# Patient Record
Sex: Female | Born: 1975 | Race: White | Hispanic: No | Marital: Married | Smoking: Never smoker
Health system: Southern US, Community
[De-identification: ages and names within clinical notes are randomized; demographics above are authoritative.]

## PROBLEM LIST (undated history)

## (undated) ENCOUNTER — Inpatient Hospital Stay (HOSPITAL_COMMUNITY): Payer: Self-pay

## (undated) DIAGNOSIS — K219 Gastro-esophageal reflux disease without esophagitis: Secondary | ICD-10-CM

## (undated) DIAGNOSIS — C801 Malignant (primary) neoplasm, unspecified: Secondary | ICD-10-CM

## (undated) DIAGNOSIS — R112 Nausea with vomiting, unspecified: Secondary | ICD-10-CM

## (undated) DIAGNOSIS — E0590 Thyrotoxicosis, unspecified without thyrotoxic crisis or storm: Secondary | ICD-10-CM

## (undated) DIAGNOSIS — O99345 Other mental disorders complicating the puerperium: Secondary | ICD-10-CM

## (undated) DIAGNOSIS — R51 Headache: Secondary | ICD-10-CM

## (undated) DIAGNOSIS — R519 Headache, unspecified: Secondary | ICD-10-CM

## (undated) DIAGNOSIS — F53 Postpartum depression: Secondary | ICD-10-CM

## (undated) DIAGNOSIS — F419 Anxiety disorder, unspecified: Secondary | ICD-10-CM

## (undated) DIAGNOSIS — Z9889 Other specified postprocedural states: Secondary | ICD-10-CM

## (undated) HISTORY — PX: WISDOM TOOTH EXTRACTION: SHX21

## (undated) HISTORY — PX: COLONOSCOPY: SHX174

---

## 2007-09-23 ENCOUNTER — Ambulatory Visit: Payer: Self-pay | Admitting: Family Medicine

## 2007-09-24 ENCOUNTER — Encounter: Payer: Self-pay | Admitting: Family Medicine

## 2008-03-25 ENCOUNTER — Ambulatory Visit: Payer: Self-pay | Admitting: Family Medicine

## 2008-03-27 ENCOUNTER — Ambulatory Visit: Payer: Self-pay | Admitting: Family Medicine

## 2008-06-22 ENCOUNTER — Ambulatory Visit: Payer: Self-pay | Admitting: Family Medicine

## 2008-06-22 DIAGNOSIS — K121 Other forms of stomatitis: Secondary | ICD-10-CM | POA: Insufficient documentation

## 2008-06-22 DIAGNOSIS — B009 Herpesviral infection, unspecified: Secondary | ICD-10-CM

## 2008-06-23 LAB — CONVERTED CEMR LAB
AST: 17 units/L (ref 0–37)
Albumin: 3.6 g/dL (ref 3.5–5.2)
BUN: 9 mg/dL (ref 6–23)
Calcium: 9 mg/dL (ref 8.4–10.5)
Chloride: 106 meq/L (ref 96–112)
Glucose, Bld: 94 mg/dL (ref 70–99)
Lymphs Abs: 2.5 10*3/uL (ref 0.7–4.0)
Monocytes Relative: 9 % (ref 3–12)
Neutro Abs: 3.6 10*3/uL (ref 1.7–7.7)
Neutrophils Relative %: 53 % (ref 43–77)
Potassium: 4.2 meq/L (ref 3.5–5.3)
RBC: 4.73 M/uL (ref 3.87–5.11)
Sodium: 141 meq/L (ref 135–145)
Total Protein: 6.9 g/dL (ref 6.0–8.3)
WBC: 6.8 10*3/uL (ref 4.0–10.5)

## 2008-06-25 ENCOUNTER — Telehealth: Payer: Self-pay | Admitting: Family Medicine

## 2008-07-02 ENCOUNTER — Ambulatory Visit: Payer: Self-pay | Admitting: Family Medicine

## 2008-07-02 LAB — CONVERTED CEMR LAB
Blood in Urine, dipstick: NEGATIVE
Glucose, Urine, Semiquant: NEGATIVE
Nitrite: NEGATIVE
Urobilinogen, UA: 0.2

## 2009-04-20 ENCOUNTER — Ambulatory Visit (HOSPITAL_COMMUNITY): Admission: RE | Admit: 2009-04-20 | Discharge: 2009-04-20 | Payer: Self-pay | Admitting: Obstetrics and Gynecology

## 2010-06-27 ENCOUNTER — Inpatient Hospital Stay (HOSPITAL_COMMUNITY): Admission: AD | Admit: 2010-06-27 | Discharge: 2010-07-01 | Payer: Self-pay | Admitting: Obstetrics and Gynecology

## 2010-09-29 ENCOUNTER — Telehealth: Payer: Self-pay | Admitting: Family Medicine

## 2010-09-29 ENCOUNTER — Ambulatory Visit: Payer: Self-pay | Admitting: Family Medicine

## 2010-09-29 DIAGNOSIS — G43909 Migraine, unspecified, not intractable, without status migrainosus: Secondary | ICD-10-CM

## 2010-10-12 ENCOUNTER — Telehealth: Payer: Self-pay | Admitting: Family Medicine

## 2010-12-13 NOTE — Assessment & Plan Note (Signed)
Summary: OM   Vital Signs:  Patient profile:   35 year old female Height:      62.3 inches Weight:      168 pounds Temp:     99.5 degrees F Pulse rate:   80 / minute BP sitting:   130 / 89  (right arm) Cuff size:   regular  Vitals Entered By: Avon Gully CMA, Duncan Dull) (September 29, 2010 1:55 PM) CC: sinus presssure x 1 week, wants refill for zomig   Primary Care Provider:  Linford Arnold, C  CC:  sinus presssure x 1 week and wants refill for zomig.  History of Present Illness: sinus presssure x 1 week, wants refill for zomig. Started with ear pressure and muffled sounds in her left ear. Started Allegra-D and would help some. Now pain and pressure over the left eye and maxillary sinus. Last night pain was severe.  Took Excedrin sinus last night and that helped some.  Nose has been runny.    Current Medications (verified): 1)  Yaz 3-0.02 Mg  Tabs (Drospirenone-Ethinyl Estradiol) .... Take One Tablet By Mouth Once A Day 2)  Zomig 5 Mg  Tabs (Zolmitriptan) .... Take One Tablet By Mouth Once A Day As Needed For H/a 3)  Acyclovir 800 Mg  Tabs (Acyclovir) .... Take 1 Tablet By Mouth Two  Times A Day For 3 Days 4)  Synthroid 50 Mcg Tabs (Levothyroxine Sodium) .... Take One Tablet By Mouth Once A Day  Allergies (verified): 1)  ! Erythromycin 2)  ! Sulfa  Comments:  Nurse/Medical Assistant: The patient's medications and allergies were reviewed with the patient and were updated in the Medication and Allergy Lists. 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  Never Smoked Alcohol use-yes Drug use-no Regular exercise-no  Physical Exam  General:  Well-developed,well-nourished,in no acute distress; alert,appropriate and cooperative throughout examination Head:  Normocephalic and atraumatic without obvious abnormalities. No apparent alopecia or balding. She is puffy under both  eyes.  Mild facial tenderness on the left under the eye.  Eyes:  No corneal or conjunctival inflammation noted. EOMI.  Ears:  External ear exam shows no significant lesions or deformities.  Right TM and canal were clear with good light reflex. Left TM with dull, erythematous with fluid.   Nose:  External nasal examination shows no deformity or inflammation.  Mouth:  Oral mucosa and oropharynx without lesions or exudates.  Teeth in good repair. Neck:  No deformities, masses, or tenderness noted. Lungs:  Normal respiratory effort, chest expands symmetrically. Lungs are clear to auscultation, no crackles or wheezes. Heart:  Normal rate and regular rhythm. S1 and S2 normal without gallop, murmur, click, rub or other extra sounds. Skin:  no rashes.  no rashes.   Cervical Nodes:  No lymphadenopathy noted Psych:  Cognition and judgment appear intact. Alert and cooperative with normal attention span and concentration. No apparent delusions, illusions, hallucinations   Impression & Recommendations:  Problem # 1:  OTITIS MEDIA, ACUTE (ICD-382.9)  The following medications were removed from the medication list:    Cipro 500 Mg Tabs (Ciprofloxacin hcl) .Marland Kitchen... Take 1 tablet by mouth two times a day Her updated medication list for this problem includes:    Augmentin 875-125 Mg Tabs (Amoxicillin-pot clavulanate) .Marland Kitchen... Take 1 tablet by mouth two times a day for 10 days  Instructed on prevention and treatment. Call if no improvement in 48-72  hours or sooner if worsening symptoms.   Problem # 2:  MIGRAINE HEADACHE (ICD-346.90) Refilled her zomig Her updated medication list for this problem includes:    Zomig 5 Mg Tabs (Zolmitriptan) .Marland Kitchen... Take one tablet by mouth once a day as needed for h/a  Complete Medication List: 1)  Yaz 3-0.02 Mg Tabs (Drospirenone-ethinyl estradiol) .... Take one tablet by mouth once a day 2)  Zomig 5 Mg Tabs (Zolmitriptan) .... Take one tablet by mouth once a day as needed for  h/a 3)  Acyclovir 800 Mg Tabs (Acyclovir) .... Take 1 tablet by mouth two  times a day for 3 days 4)  Synthroid 50 Mcg Tabs (Levothyroxine sodium) .... Take one tablet by mouth once a day 5)  Augmentin 875-125 Mg Tabs (Amoxicillin-pot clavulanate) .... Take 1 tablet by mouth two times a day for 10 days  Patient Instructions: 1)  Call if not better by Monday. Complete all the antibiotic.  Prescriptions: AUGMENTIN 875-125 MG TABS (AMOXICILLIN-POT CLAVULANATE) Take 1 tablet by mouth two times a day for 10 days  #20 x 0   Entered and Authorized by:   Nani Gasser MD   Signed by:   Nani Gasser MD on 09/29/2010   Method used:   Electronically to        [facility]* (retail)       [address] [place], [place]  [address]       Ph: [phone]       Fax: [phone]   RxID:   [id number] ZOMIG 5 MG  TABS (ZOLMITRIPTAN) Take one tablet by mouth once a day as needed for H/A  #9 tabs x 3   Entered and Authorized by:   Nani Gasser MD   Signed by:   Nani Gasser MD on 09/29/2010   Method used:   Electronically to        [facility]* (retail)       [address] [place], [place]  [address]       Ph: [phone]       Fax: [phone]   RxID:   [id number]    Orders Added: 1)  Est. Patient Level III [16010]    Laboratory Results

## 2010-12-13 NOTE — Progress Notes (Signed)
  Phone Note Call from Patient   Caller: Patient Summary of Call: called insu co fro PA on zomig and ins does not require PA for this med. notified pt and pharm Initial call taken by: Avon Gully CMA, Duncan Dull),  October 12, 2010 3:42 PM

## 2010-12-13 NOTE — Progress Notes (Signed)
Summary: Zomig not covered by ins. needs Imitrex sent first  Phone Note Call from Patient Call back at Home Phone [phone]   Caller: Patient Call For: Nani Gasser MD Summary of Call: Insurance won't cover the Zomig. Insurance wants Imitrex generic tried first. Please send this to her pharmacy Initial call taken by: Kathlene November LPN,  September 29, 2010 4:05 PM  Follow-up for Phone Call        Rx Called In Follow-up by: Nani Gasser MD,  September 29, 2010 4:20 PM    New/Updated Medications: SUMATRIPTAN SUCCINATE 100 MG TABS (SUMATRIPTAN SUCCINATE) Take 1 tablet by mouth once a day as needed for migraine. Can repeat dose in 2 hours Prescriptions: SUMATRIPTAN SUCCINATE 100 MG TABS (SUMATRIPTAN SUCCINATE) Take 1 tablet by mouth once a day as needed for migraine. Can repeat dose in 2 hours  #6 x 0   Entered and Authorized by:   Nani Gasser MD   Signed by:   Nani Gasser MD on 09/29/2010   Method used:   Electronically to        [facility]* (retail)       [address] [place], [place]  [address]       Ph: [phone]       Fax: [phone]   RxID:   [id number]

## 2010-12-27 ENCOUNTER — Encounter: Payer: Self-pay | Admitting: Family Medicine

## 2010-12-27 ENCOUNTER — Ambulatory Visit (INDEPENDENT_AMBULATORY_CARE_PROVIDER_SITE_OTHER): Payer: BC Managed Care – PPO | Admitting: Family Medicine

## 2010-12-27 DIAGNOSIS — J069 Acute upper respiratory infection, unspecified: Secondary | ICD-10-CM

## 2010-12-27 DIAGNOSIS — J029 Acute pharyngitis, unspecified: Secondary | ICD-10-CM | POA: Insufficient documentation

## 2011-01-04 NOTE — Assessment & Plan Note (Signed)
Summary: Sore THroat    Vital Signs:  Patient profile:   35 year old female Height:      62.3 inches Weight:      174 pounds O2 Sat:      98.2 % on Room air Temp:     98.2 degrees F oral Pulse rate:   74 / minute BP sitting:   124 / 78  (right arm) Cuff size:   regular  Vitals Entered By: Avon Gully CMA, Duncan Dull) (December 27, 2010 9:14 AM)  O2 Flow:  Room air CC: chest congestion, sore throat, some drainage   Primary Care Provider:  Linford Arnold, C  CC:  chest congestion, sore throat, and some drainage.  History of Present Illness: chest congestion, sore throat, some drainage.  Started with a ST about a week ago.  Then about 3 days ago started with a cough.  45 month old with an ear infection. No ear pain or head pressure.  Nasal drip.  Cough is productive.  No fever. Using chlorseptic lozenges.  Some diarrhea for 4-5 days.  No nausea. Throat is still really sore. Worse at night.   Current Medications (verified): 1)  Yaz 3-0.02 Mg  Tabs (Drospirenone-Ethinyl Estradiol) .... Take One Tablet By Mouth Once A Day 2)  Sumatriptan Succinate 100 Mg Tabs (Sumatriptan Succinate) .... Take 1 Tablet By Mouth Once A Day As Needed For Migraine. Can Repeat Dose in 2 Hours 3)  Synthroid 50 Mcg Tabs (Levothyroxine Sodium) .... Take One Tablet By Mouth Once A Day  Allergies (verified): 1)  ! Erythromycin 2)  ! Sulfa  Comments:  Nurse/Medical Assistant: The patient's medications and allergies were reviewed with the patient and were updated in the Medication and Allergy Lists. Avon Gully CMA, Duncan Dull) (December 27, 2010 9:15 AM)  Physical Exam  General:  Well-developed,well-nourished,in no acute distress; alert,appropriate and cooperative throughout examination Head:  Normocephalic and atraumatic without obvious abnormalities. No apparent alopecia or balding. Some fullnes under her eyes.  Eyes:  No corneal or conjunctival inflammation noted. EOMI. Perrla.  Ears:  External ear exam  shows no significant lesions or deformities.  Otoscopic examination reveals clear canals, tympanic membranes are intact bilaterally without bulging, retraction, inflammation or discharge. Hearing is grossly normal bilaterally. Nose:  External nasal examination shows no deformity or inflammation. Mouth:  Oral mucosa and oropharynx without lesions or exudates.  Teeth in good repair. Neck:  No deformities, masses, or tenderness noted. Lungs:  Normal respiratory effort, chest expands symmetrically. Lungs are clear to auscultation, no crackles or wheezes. Heart:  Normal rate and regular rhythm. S1 and S2 normal without gallop, murmur, click, rub or other extra sounds. Skin:  no rashes.   Cervical Nodes:  No lymphadenopathy noted Psych:  Cognition and judgment appear intact. Alert and cooperative with normal attention span and concentration. No apparent delusions, illusions, hallucinations   Impression & Recommendations:  Problem # 1:  URI (ICD-465.9)  Instructed on symptomatic treatment. Call if symptoms persist or worsen.  Call if not bette rin 4-5 days or suddenly get worse.   Problem # 2:  ACUTE PHARYNGITIS (ICD-462)  Rapid strep was negl. Likely vira.  The following medications were removed from the medication list:    Augmentin 875-125 Mg Tabs (Amoxicillin-pot clavulanate) .Marland Kitchen... Take 1 tablet by mouth two times a day for 10 days  Orders: Rapid Strep (40981)  Complete Medication List: 1)  Yaz 3-0.02 Mg Tabs (Drospirenone-ethinyl estradiol) .... Take one tablet by mouth once a day 2)  Sumatriptan  Succinate 100 Mg Tabs (Sumatriptan succinate) .... Take 1 tablet by mouth once a day as needed for migraine. can repeat dose in 2 hours 3)  Synthroid 50 Mcg Tabs (Levothyroxine sodium) .... Take one tablet by mouth once a day   Orders Added: 1)  Rapid Strep [16109] 2)  Est. Patient Level III [60454]    Laboratory Results  Date/Time Received: 12/27/10 Date/Time Reported:  12/27/10  Other Tests  Rapid Strep: negative

## 2011-01-27 LAB — CBC
HCT: 35.3 % — ABNORMAL LOW (ref 36.0–46.0)
MCH: 30.6 pg (ref 26.0–34.0)
MCHC: 33.5 g/dL (ref 30.0–36.0)
MCHC: 34.3 g/dL (ref 30.0–36.0)
MCV: 88.5 fL (ref 78.0–100.0)
MCV: 89.1 fL (ref 78.0–100.0)
Platelets: 191 10*3/uL (ref 150–400)
Platelets: 254 10*3/uL (ref 150–400)
RBC: 3.51 MIL/uL — ABNORMAL LOW (ref 3.87–5.11)
RDW: 14.1 % (ref 11.5–15.5)
RDW: 14.3 % (ref 11.5–15.5)
WBC: 12.5 10*3/uL — ABNORMAL HIGH (ref 4.0–10.5)

## 2011-09-04 ENCOUNTER — Inpatient Hospital Stay (INDEPENDENT_AMBULATORY_CARE_PROVIDER_SITE_OTHER)
Admission: RE | Admit: 2011-09-04 | Discharge: 2011-09-04 | Disposition: A | Discharge status: Home / Self Care | Payer: BC Managed Care – PPO | Source: Ambulatory Visit | Attending: Family Medicine | Admitting: Family Medicine

## 2011-09-04 ENCOUNTER — Encounter: Payer: Self-pay | Admitting: Family Medicine

## 2011-09-04 DIAGNOSIS — E039 Hypothyroidism, unspecified: Secondary | ICD-10-CM | POA: Insufficient documentation

## 2011-09-04 DIAGNOSIS — J069 Acute upper respiratory infection, unspecified: Secondary | ICD-10-CM

## 2011-09-14 ENCOUNTER — Ambulatory Visit (INDEPENDENT_AMBULATORY_CARE_PROVIDER_SITE_OTHER): Payer: BC Managed Care – PPO | Admitting: Family Medicine

## 2011-09-14 DIAGNOSIS — B001 Herpesviral vesicular dermatitis: Secondary | ICD-10-CM

## 2011-09-14 DIAGNOSIS — B009 Herpesviral infection, unspecified: Secondary | ICD-10-CM

## 2011-09-14 DIAGNOSIS — R220 Localized swelling, mass and lump, head: Secondary | ICD-10-CM

## 2011-09-14 MED ORDER — VALACYCLOVIR HCL 1 G PO TABS: 2000.0000 mg | ORAL_TABLET | Freq: Two times a day (BID) | ORAL | Status: AC

## 2011-09-14 MED ORDER — PREDNISONE 20 MG PO TABS: 40.0000 mg | ORAL_TABLET | Freq: Every day | ORAL | Status: AC

## 2011-09-14 NOTE — Patient Instructions (Signed)
Call if not getting better.  

## 2011-09-14 NOTE — Progress Notes (Signed)
  Subjective:    Patient ID: Tina Hart, female    DOB: 06-19-1976, 35 y.o.   MRN: [id number]  HPI Seen last week at [facility] for sinus sxs an d cough and felt better on zpack. Nasal Drainage started again yesterday AM, and wake up this am and says the left side of her face is swollen and "filled with fluid".  No sinus congestion.  Cough is resolved.  No fever. Also cold sore popped up yesteday on that left corner of the mouth. No GI symptoms. She had a similar episode a couple years ago when she got a cold sore and she had multiple lymph nodes in her neck and upper chest area that were swollen and sore. She has not been using any treatments or medications.  Review of Systems     Objective:   Physical Exam  Constitutional: She is oriented to person, place, and time. She appears well-developed and well-nourished.  HENT:  Head: Normocephalic and atraumatic.  Right Ear: External ear normal.  Left Ear: External ear normal.  Nose: Nose normal.  Mouth/Throat: Oropharynx is clear and moist.       TMs and canals are clear.   Eyes: Conjunctivae and EOM are normal. Pupils are equal, round, and reactive to light.  Neck: Neck supple. No thyromegaly present.  Cardiovascular: Normal rate, regular rhythm and normal heart sounds.   Pulmonary/Chest: Effort normal and breath sounds normal. She has no wheezes.  Lymphadenopathy:    She has no cervical adenopathy.  Neurological: She is alert and oriented to person, place, and time.  Skin: Skin is warm and dry.  Psychiatric: She has a normal mood and affect.      Left lower jaw line is swollen. No erythema or fluctuance. She is not tender along the gum line. She does have a small cold sore on the left corner crease of mouth. OP is normal.     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 I will go ahead and order back on Valtrex which she has responded to well in the past. If the swelling doesn't  significantly improve in the next 48 hours and she can go ahead and fill the prescription for prednisone since that will be over the weekend. I warned her that if she spikes a fever or starts to notice redness over the swollen area that she needs to seek medical care immediately.

## 2011-10-16 NOTE — Progress Notes (Signed)
Summary: running nose,coughing,chest congestion (rm 3)   Vital Signs:  Patient Profile:   35 Years Old Female CC:      cough, congestion, runny nose x 4 days Height:     62.3 inches Weight:      171 pounds O2 Sat:      99 % O2 treatment:    Room Air Temp:     98.6 degrees F oral Pulse rate:   62 / minute Resp:     16 per minute BP sitting:   118 / 80  (left arm) Cuff size:   regular  Vitals Entered By: Lajean Saver RN (September 04, 2011 11:49 AM)                  Updated Prior Medication List: YAZ 3-0.02 MG  TABS (DROSPIRENONE-ETHINYL ESTRADIOL) Take one tablet by mouth once a day SUMATRIPTAN SUCCINATE 100 MG TABS (SUMATRIPTAN SUCCINATE) Take 1 tablet by mouth once a day as needed for migraine. Can repeat dose in 2 hours SYNTHROID 50 MCG TABS (LEVOTHYROXINE SODIUM) take one tablet by mouth once a day  Current Allergies (reviewed today): ! ERYTHROMYCIN ! SULFAHistory of Present Illness Chief Complaint: cough, congestion, runny nose x 4 days History of Present Illness:  Subjective: Patient complains of URI symptoms for 4 days that started with sneezing + productive cough started yesterday No sore throat No pleuritic pain No wheezing + nasal congestion + post-nasal drainage + sinus pain/pressure No itchy/red eyes No earache No hemoptysis No SOB No fever/chills No nausea No vomiting No abdominal pain No diarrhea No skin rashes + fatigue No myalgias No headache Used OTC meds without relief (Daquil, Nyquil)  REVIEW OF SYSTEMS Constitutional Symptoms      Denies fever, chills, night sweats, weight loss, weight gain, and fatigue.  Eyes       Denies change in vision, eye pain, eye discharge, glasses, contact lenses, and eye surgery. Ear/Nose/Throat/Mouth       Complains of sore throat.      Denies hearing loss/aids, change in hearing, ear pain, ear discharge, dizziness, frequent runny nose, frequent nose bleeds, sinus problems, hoarseness, and tooth pain or  bleeding.      Comments: congestion Respiratory       Complains of productive cough.      Denies dry cough, wheezing, shortness of breath, asthma, bronchitis, and emphysema/COPD.  Cardiovascular       Denies murmurs, chest pain, and tires easily with exhertion.    Gastrointestinal       Denies stomach pain, nausea/vomiting, diarrhea, constipation, blood in bowel movements, and indigestion. Genitourniary       Denies painful urination, blood or discharge from vagina, kidney stones, and loss of urinary control. Neurological       Denies paralysis, seizures, and fainting/blackouts. Musculoskeletal       Denies muscle pain, joint pain, joint stiffness, decreased range of motion, redness, swelling, muscle weakness, and gout.  Skin       Denies bruising, unusual mles/lumps or sores, and hair/skin or nail changes.  Psych       Denies mood changes, temper/anger issues, anxiety/stress, speech problems, depression, and sleep problems. Other Comments: Patient c/o symptoms x 4 days. Taken day/nyquil otc   Past History:  Past Medical History: Hx menstrual Migraines Hypothyroidism  Past Surgical History:  Caesarean section  Family History: Reviewed history from 09/23/2007 and no changes required. Mother had melanoma removed 30 years ago. Father with MI, CABG, hi cholesterol, HTN  Social History:  Reviewed history from 09/29/2010 and no changes required. 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  Never Smoked Alcohol use-yes Drug use-no Regular exercise-no   Objective:  Appearance:  Patient appears healthy, stated age, and in no acute distress  Eyes:  Pupils are equal, round, and reactive to light and accomodation.  Extraocular movement is intact.  Conjunctivae are not inflamed.  Ears:  Canals normal.  Tympanic membranes normal.   Nose:  Mildly congested turbinates.  No sinus tenderness  Pharynx:  Normal  Neck:  Supple.  Tender shotty posterior  nodes are palpated bilaterally.  Lungs:  Clear to auscultation.  Breath sounds are equal.  Chest:  Distinct tenderness to palpation over the mid-sternum  Heart:  Regular rate and rhythm without murmurs, rubs, or gallops.  Abdomen:  Nontender without masses or hepatosplenomegaly.  Bowel sounds are present.  No CVA or flank tenderness.  Skin:  No rash Assessment New Problems: UPPER RESPIRATORY INFECTION, ACUTE (ICD-465.9) HYPOTHYROIDISM (ICD-244.9)  NO EVIDENCE BACTERIAL INFECTION TODAY  Plan New Medications/Changes: BENZONATATE 200 MG CAPS (BENZONATATE) One by mouth hs as needed cough  #12 x 0, 09/04/2011, Donna Christen MD AZITHROMYCIN 250 MG TABS (AZITHROMYCIN) Two tabs by mouth on day 1, then 1 tab daily on days 2 through 5 (Rx void after 09/11/11)  #6 tabs x 0, 09/04/2011, Donna Christen MD  New Orders: Pulse Oximetry (single measurment) [40981] New Patient Level III [19147] Planning Comments:   Treat symptomatically for now:  Increase fluid intake, begin expectorant/decongestant, topical decongestant,  cough suppressant at bedtime.  If fever/chills/sweats persist, or if not improving 5  days begin Z-pack (given Rx to hold).  Followup with PCP if not improving 7 to 10 days.   The patient and/or caregiver has been counseled thoroughly with regard to medications prescribed including dosage, schedule, interactions, rationale for use, and possible side effects and they verbalize understanding.  Diagnoses and expected course of recovery discussed and will return if not improved as expected or if the condition worsens. Patient and/or caregiver verbalized understanding.  Prescriptions: BENZONATATE 200 MG CAPS (BENZONATATE) One by mouth hs as needed cough  #12 x 0   Entered and Authorized by:   Donna Christen MD   Signed by:   Donna Christen MD on 09/04/2011   Method used:   Print then Give to Patient   RxID:   [id number] AZITHROMYCIN 250 MG TABS (AZITHROMYCIN) Two tabs by mouth on day  1, then 1 tab daily on days 2 through 5 (Rx void after 09/11/11)  #6 tabs x 0   Entered and Authorized by:   Donna Christen MD   Signed by:   Donna Christen MD on 09/04/2011   Method used:   Print then Give to Patient   RxID:   [id number]   Patient Instructions: 1)  Take Mucinex D (guaifenesin with decongestant) twice daily for congestion. 2)  Increase fluid intake, rest. 3)  May use Afrin nasal spray (or generic oxymetazoline) twice daily for about 5 days.  Also recommend using saline nasal spray several times daily and/or saline nasal irrigation. 4)  Begin Azithromycin if not improving about 5 days or if persistent fever develops. 5)  Followup with family doctor if not improving 7 to 10 days.   Orders Added: 1)  Pulse Oximetry (single measurment) [94760] 2)  New Patient Level III [27253]

## 2012-01-31 ENCOUNTER — Ambulatory Visit (INDEPENDENT_AMBULATORY_CARE_PROVIDER_SITE_OTHER): Payer: BC Managed Care – PPO | Admitting: Family Medicine

## 2012-01-31 DIAGNOSIS — J019 Acute sinusitis, unspecified: Secondary | ICD-10-CM

## 2012-01-31 DIAGNOSIS — Z13220 Encounter for screening for lipoid disorders: Secondary | ICD-10-CM

## 2012-01-31 MED ORDER — ZOLMITRIPTAN 5 MG PO TABS: 5.0000 mg | ORAL_TABLET | ORAL | Status: DC | PRN

## 2012-01-31 MED ORDER — AMOXICILLIN-POT CLAVULANATE 875-125 MG PO TABS: 1.0000 | ORAL_TABLET | Freq: Two times a day (BID) | ORAL | Status: DC

## 2012-01-31 NOTE — Progress Notes (Signed)
  Subjective:    Patient ID: Tina Hart, female    DOB: 11/08/1976, 36 y.o.   MRN: [id number]  HPI Nasal congestion for over a week, approx 9 days. Has been using mucinex and helps some.  [place] and had lots of pressure. Has had watery eyes and sneezing.  Never had allergies.  No fever.  No swollen glands.  Did have a cold sore before this started.  Aching in her upper back.  + drianage cough.  No GI upset.  Taking Aleve- D and helps some.  Right maxillary sinus is more painful than the left.    Review of Systems     Objective:   Physical Exam  Constitutional: She is oriented to person, place, and time. She appears well-developed and well-nourished.  HENT:  Head: Normocephalic and atraumatic.  Right Ear: External ear normal.  Left Ear: External ear normal.  Nose: Nose normal.  Mouth/Throat: Oropharynx is clear and moist.       TMs and canals are clear.   Eyes: Conjunctivae and EOM are normal. Pupils are equal, round, and reactive to light.  Neck: Neck supple. No thyromegaly present.  Cardiovascular: Normal rate, regular rhythm and normal heart sounds.   Pulmonary/Chest: Effort normal and breath sounds normal. She has no wheezes.  Lymphadenopathy:    She has no cervical adenopathy.  Neurological: She is alert and oriented to person, place, and time.  Skin: Skin is warm and dry.  Psychiatric: She has a normal mood and affect.          Assessment & Plan:  Acute sinusitis - Likely bacterial since sxs for 9 days and now more pain on her right maxillary sinus area.  Augmentin bid x 10 days. Call if not better in one week. Symptomatic care as needed.    Due for lipid screening .will order today.

## 2012-01-31 NOTE — Patient Instructions (Signed)

## 2012-02-05 ENCOUNTER — Telehealth: Payer: Self-pay | Admitting: *Deleted

## 2012-02-05 MED ORDER — FLUCONAZOLE 150 MG PO TABS: 150.0000 mg | ORAL_TABLET | Freq: Once | ORAL | Status: DC

## 2012-02-05 NOTE — Telephone Encounter (Signed)
Sent rx for diflucan.  

## 2012-02-05 NOTE — Telephone Encounter (Signed)
Pt states that since taking the augmentin that she now has a yeast infection and would like to know if we can send rx to pharmacy.

## 2012-02-06 ENCOUNTER — Other Ambulatory Visit: Payer: Self-pay | Admitting: *Deleted

## 2012-02-06 MED ORDER — FLUCONAZOLE 150 MG PO TABS: 150.0000 mg | ORAL_TABLET | Freq: Once | ORAL | Status: DC

## 2012-03-22 ENCOUNTER — Ambulatory Visit (INDEPENDENT_AMBULATORY_CARE_PROVIDER_SITE_OTHER): Payer: BC Managed Care – PPO | Admitting: Physician Assistant

## 2012-03-22 ENCOUNTER — Encounter: Payer: Self-pay | Admitting: Physician Assistant

## 2012-03-22 VITALS — BP 115/73 | HR 76 | Temp 98.7°F | Ht 63.0 in | Wt 150.0 lb

## 2012-03-22 DIAGNOSIS — J029 Acute pharyngitis, unspecified: Secondary | ICD-10-CM

## 2012-03-22 LAB — POCT RAPID STREP A (OFFICE): Rapid Strep A Screen: NEGATIVE

## 2012-03-22 NOTE — Progress Notes (Signed)
  Subjective:    Patient ID: Tina Hart, female    DOB: 1976/11/07, 36 y.o.   MRN: [id number]  HPI Patient presents to the clinic with sore throat 3 days. She ran a low grade temp of 100 a couple of times but tylenol brought it back down. She has not ran a temp in 24 hours. She does have a cough that is sometimes productive. 2 days ago she started to lose her voice. She has been able to eat and drink with no problem. She denies SOB, ear pain, and wheezing. She has not had any sinus pressure but has had some nasal congestion.       Review of Systems     Objective:   Physical Exam  Constitutional: She is oriented to person, place, and time. She appears well-developed and well-nourished.  HENT:  Head: Normocephalic and atraumatic.  Right Ear: External ear normal.  Left Ear: External ear normal.  Nose: Nose normal.  Mouth/Throat: No oropharyngeal exudate.       TM's normal. Orophayrnx was erythematous. Neg for maxillary tenderness.   Eyes: Conjunctivae are normal.  Neck: Normal range of motion. Neck supple.       Bilateral enlarged ant cervical lymphnodes.   Cardiovascular: Normal rate, regular rhythm and normal heart sounds.   Pulmonary/Chest: Effort normal and breath sounds normal. She has no wheezes.  Lymphadenopathy:    She has cervical adenopathy.  Neurological: She is alert and oriented to person, place, and time.  Skin: Skin is warm and dry.  Psychiatric: She has a normal mood and affect. Her behavior is normal.          Assessment & Plan:  Viral Pharyngitis/Sore throat-Rapid strep was negative today. Most sore throats are viral. Let's treat with symptomatic care and if not improving by Monday call office. Gargle with salt water. Start Mucinex twice a day and drink lots of water. Try to get mucus and congestion out. Tylenol and Motrin for throat pain. Gave flonase to use as needed for nasal congestion.

## 2012-03-22 NOTE — Patient Instructions (Signed)
Rapid strep was negative today. Most sore throats are viral. Let's treat with symptomatic care and if not improving by Monday call office. Gargle with salt water. Start Mucinex twice a day and drink lots of water. Try to get mucus and congestion out. Tylenol and Motrin for throat pain.   Viral Pharyngitis Viral pharyngitis is a viral infection that produces redness, pain, and swelling (inflammation) of the throat. It can spread from person to person (contagious). CAUSES Viral pharyngitis is caused by inhaling a large amount of certain germs called viruses. Many different viruses cause viral pharyngitis. SYMPTOMS Symptoms of viral pharyngitis include:  Sore throat.   Tiredness.   Stuffy nose.   Low-grade fever.   Congestion.   Cough.  TREATMENT Treatment includes rest, drinking plenty of fluids, and the use of over-the-counter medication (approved by your caregiver). HOME CARE INSTRUCTIONS   Drink enough fluids to keep your urine clear or pale yellow.   Eat soft, cold foods such as ice cream, frozen ice pops, or gelatin dessert.   Gargle with warm salt water (1 tsp salt per 1 qt of water).   If over age 78, throat lozenges may be used safely.   Only take over-the-counter or prescription medicines for pain, discomfort, or fever as directed by your caregiver. Do not take aspirin.  To help prevent spreading viral pharyngitis to others, avoid:  Mouth-to-mouth contact with others.   Sharing utensils for eating and drinking.   Coughing around others.  SEEK MEDICAL CARE IF:   You are better in a few days, then become worse.   You have a fever or pain not helped by pain medicines.   There are any other changes that concern you.  Document Released: 08/09/2005 Document Revised: 10/19/2011 Document Reviewed: 01/05/2011 [facility] Patient Information 2012 [place], [place].

## 2012-03-23 MED ORDER — FLUTICASONE PROPIONATE 50 MCG/ACT NA SUSP: 2.0000 | Freq: Every day | NASAL | Status: DC

## 2012-03-26 ENCOUNTER — Encounter: Payer: Self-pay | Admitting: Family Medicine

## 2012-03-26 ENCOUNTER — Ambulatory Visit (INDEPENDENT_AMBULATORY_CARE_PROVIDER_SITE_OTHER): Payer: BC Managed Care – PPO | Admitting: Family Medicine

## 2012-03-26 VITALS — BP 124/83 | HR 78 | Temp 98.2°F | Wt 149.0 lb

## 2012-03-26 DIAGNOSIS — J40 Bronchitis, not specified as acute or chronic: Secondary | ICD-10-CM

## 2012-03-26 DIAGNOSIS — J329 Chronic sinusitis, unspecified: Secondary | ICD-10-CM

## 2012-03-26 MED ORDER — AMOXICILLIN-POT CLAVULANATE 875-125 MG PO TABS: 1.0000 | ORAL_TABLET | Freq: Two times a day (BID) | ORAL | Status: AC

## 2012-03-26 MED ORDER — FLUCONAZOLE 150 MG PO TABS: 150.0000 mg | ORAL_TABLET | Freq: Once | ORAL | Status: AC

## 2012-03-26 NOTE — Progress Notes (Signed)
  Subjective:    Patient ID: Tina Hart, female    DOB: 1976/03/30, 36 y.o.   MRN: [id number]  HPI Started Wedenday (7 days ago) with weakness and HA.  Then developed laryngitis.  Mild ST with cough.  Cough w/ yellow sputum.  Fullness in bilat ears.  Chest hurting from cough.  Cough is worse since saw Jade.  Hx of bronchitis.  Maxillary sinus tenderness. Tender LN in the neck as well.  Tried mucinex, tylenol and throat spray.  Fever most days this week.  Did take a tylenol this AM.  Not a smoker. + runny nose.    Review of Systems     Objective:   Physical Exam  Constitutional: She is oriented to person, place, and time. She appears well-developed and well-nourished.  HENT:  Head: Normocephalic and atraumatic.  Right Ear: External ear normal.  Left Ear: External ear normal.  Nose: Nose normal.  Mouth/Throat: Oropharynx is clear and moist.       TMs and canals are clear. Tender over the maxillary sinuses.   Eyes: Conjunctivae and EOM are normal. Pupils are equal, round, and reactive to light.  Neck: Neck supple. No thyromegaly present.  Cardiovascular: Normal rate, regular rhythm and normal heart sounds.   Pulmonary/Chest: Effort normal and breath sounds normal. She has no wheezes.  Lymphadenopathy:    She has no cervical adenopathy.  Neurological: She is alert and oriented to person, place, and time.  Skin: Skin is warm and dry.  Psychiatric: She has a normal mood and affect.          Assessment & Plan:  Sinusitis/bronchitis- Likely viral but has been one week and feeling worse.  Will give rx ut asked her to give it 2-3 more days to see if starts to feel better. If not or if suddenly feels worse then can fill the rx sooner than later. Continue symptomatic care as tolerated.

## 2012-03-26 NOTE — Patient Instructions (Signed)

## 2013-02-26 ENCOUNTER — Other Ambulatory Visit: Payer: Self-pay | Admitting: Family Medicine

## 2013-02-27 ENCOUNTER — Other Ambulatory Visit: Payer: Self-pay | Admitting: Family Medicine

## 2013-02-27 MED ORDER — ZOLMITRIPTAN 5 MG PO TABS: ORAL_TABLET | ORAL | Status: DC

## 2013-05-06 ENCOUNTER — Encounter: Payer: Self-pay | Admitting: Family Medicine

## 2013-05-06 ENCOUNTER — Ambulatory Visit (INDEPENDENT_AMBULATORY_CARE_PROVIDER_SITE_OTHER): Payer: BC Managed Care – PPO | Admitting: Family Medicine

## 2013-05-06 VITALS — BP 119/80 | HR 64 | Wt 167.0 lb

## 2013-05-06 DIAGNOSIS — R635 Abnormal weight gain: Secondary | ICD-10-CM

## 2013-05-06 DIAGNOSIS — Z0000 Encounter for general adult medical examination without abnormal findings: Secondary | ICD-10-CM

## 2013-05-06 DIAGNOSIS — E039 Hypothyroidism, unspecified: Secondary | ICD-10-CM

## 2013-05-06 NOTE — Patient Instructions (Signed)
Keep up a regular exercise program and make sure you are eating a healthy diet Try to eat 4 servings of dairy a day, or if you are lactose intolerant take a calcium with vitamin D daily.  Your vaccines are up to date.   

## 2013-05-06 NOTE — Progress Notes (Signed)
  Subjective:     Tina Hart is a 37 y.o. female and is here for a comprehensive physical exam. The patient reports problems - has gained 20 lbs in the lst 6 months. Has occ vertigo that lasts a few seconds. She is exercising 4-5 days per week. Skin has been really dry. No fevers or abnorma lumps/nodules/LN. Eye have been more puffy the last coupel of months as well. No CP or SOB.   History   Social History  . Marital Status: Married    Spouse Name: N/A    Number of Children: N/A  . Years of Education: N/A   Occupational History  . Not on file.   Social History Main Topics  . Smoking status: Never Smoker   . Smokeless tobacco: Not on file  . Alcohol Use: Not on file  . Drug Use: Not on file  . Sexually Active: Not on file   Other Topics Concern  . Not on file   Social History Narrative  . No narrative on file   Health Maintenance  Topic Date Due  . Influenza Vaccine  07/14/2013  . Pap Smear  08/14/2015  . Tetanus/tdap  09/22/2017    The following portions of the patient's history were reviewed and updated as appropriate: allergies, current medications, past family history, past medical history, past social history, past surgical history and problem list.  Review of Systems A comprehensive review of systems was negative.   Objective:    BP 119/80  Pulse 64  Wt 167 lb (75.751 kg)  BMI 29.59 kg/m2 General appearance: alert, cooperative and appears stated age Head: Normocephalic, without obvious abnormality, atraumatic Eyes: conj cleear, EOMI,  lower eyelids are swollen Ears: normal TM's and external ear canals both ears Nose: Nares normal. Septum midline. Mucosa normal. No drainage or sinus tenderness. Throat: lips, mucosa, and tongue normal; teeth and gums normal Neck: no adenopathy, no carotid bruit, no JVD, supple, symmetrical, trachea midline and thyroid not enlarged, symmetric, no tenderness/mass/nodules Back: symmetric, no curvature. ROM normal. No CVA  tenderness. Lungs: clear to auscultation bilaterally Heart: regular rate and rhythm, S1, S2 normal, no murmur, click, rub or gallop Abdomen: soft, non-tender; bowel sounds normal; no masses,  no organomegaly Extremities: extremities normal, atraumatic, no cyanosis or edema Pulses: 2+ and symmetric Skin: Skin color, texture, turgor normal. No rashes or lesions Lymph nodes: Cervical, supraclavicular, and axillary nodes normal. Neurologic: Alert and oriented X 3, normal strength and tone. Normal symmetric reflexes. Normal coordination and gait    Assessment:    Healthy female exam.     Plan:     See After Visit Summary for Counseling Recommendations  Keep up a regular exercise program and make sure you are eating a healthy diet Try to eat 4 servings of dairy a day, or if you are lactose intolerant take a calcium with vitamin D daily.  Your vaccines are up to date.  Due for screening CMP and lipid panel.  She sees Dr. Billy Coast on for her GYN exam which is up today as well as her breast exam.  Hypothyroidism-we'll recheck levels this week. I suspect this may be responsible for her recent abrupt weight gain as well as the swelling around her eyes and face and her excessively dry skin. Also check a.m. Cortisol. If not can work up fruther.

## 2013-05-07 ENCOUNTER — Other Ambulatory Visit: Payer: Self-pay | Admitting: Family Medicine

## 2013-05-07 MED ORDER — ZOLMITRIPTAN 5 MG PO TABS: ORAL_TABLET | ORAL | Status: DC

## 2013-05-08 LAB — COMPLETE METABOLIC PANEL WITH GFR
ALT: 11 U/L (ref 0–35)
AST: 12 U/L (ref 0–37)
CO2: 26 mEq/L (ref 19–32)
Calcium: 8.9 mg/dL (ref 8.4–10.5)
Chloride: 108 mEq/L (ref 96–112)
Creat: 0.69 mg/dL (ref 0.50–1.10)
GFR, Est African American: >89 mL/min
Potassium: 4.6 mEq/L (ref 3.5–5.3)
Sodium: 142 mEq/L (ref 135–145)
Total Protein: 6.6 g/dL (ref 6.0–8.3)

## 2013-05-08 LAB — LIPID PANEL
LDL Cholesterol: 114 mg/dL — ABNORMAL HIGH (ref 0–99)
VLDL: 12 mg/dL (ref 0–40)

## 2014-03-05 LAB — OB RESULTS CONSOLE ABO/RH: RH TYPE: POSITIVE

## 2014-03-05 LAB — OB RESULTS CONSOLE RUBELLA ANTIBODY, IGM: RUBELLA: IMMUNE

## 2014-03-05 LAB — OB RESULTS CONSOLE HEPATITIS B SURFACE ANTIGEN: Hepatitis B Surface Ag: NEGATIVE

## 2014-03-05 LAB — OB RESULTS CONSOLE RPR: RPR: NONREACTIVE

## 2014-03-05 LAB — OB RESULTS CONSOLE HIV ANTIBODY (ROUTINE TESTING): HIV: NONREACTIVE

## 2014-03-05 LAB — OB RESULTS CONSOLE ANTIBODY SCREEN: ANTIBODY SCREEN: NEGATIVE

## 2014-03-12 LAB — OB RESULTS CONSOLE GC/CHLAMYDIA
Chlamydia: NEGATIVE
Gonorrhea: NEGATIVE

## 2014-08-08 ENCOUNTER — Encounter (HOSPITAL_COMMUNITY): Payer: Self-pay | Admitting: *Deleted

## 2014-08-08 ENCOUNTER — Inpatient Hospital Stay (HOSPITAL_COMMUNITY)
Admission: AD | Admit: 2014-08-08 | Discharge: 2014-08-08 | Disposition: A | Discharge status: Home / Self Care | Payer: BC Managed Care – PPO | Source: Ambulatory Visit | Attending: Obstetrics & Gynecology | Admitting: Obstetrics & Gynecology

## 2014-08-08 DIAGNOSIS — O4700 False labor before 37 completed weeks of gestation, unspecified trimester: Secondary | ICD-10-CM | POA: Diagnosis not present

## 2014-08-08 HISTORY — DX: Thyrotoxicosis, unspecified without thyrotoxic crisis or storm: E05.90

## 2014-08-08 LAB — URINALYSIS, ROUTINE W REFLEX MICROSCOPIC
BILIRUBIN URINE: NEGATIVE
Glucose, UA: NEGATIVE mg/dL
Hgb urine dipstick: NEGATIVE
Ketones, ur: 15 mg/dL — AB
Leukocytes, UA: NEGATIVE
NITRITE: NEGATIVE
Protein, ur: NEGATIVE mg/dL
UROBILINOGEN UA: 0.2 mg/dL (ref 0.0–1.0)
pH: 6.5 (ref 5.0–8.0)

## 2014-08-08 LAB — FETAL FIBRONECTIN: Fetal Fibronectin: NEGATIVE

## 2014-08-08 MED ORDER — LACTATED RINGERS IV BOLUS (SEPSIS)
1000.0000 mL | Freq: Once | INTRAVENOUS | Status: AC
  Administered 2014-08-08: 1000 mL via INTRAVENOUS

## 2014-08-08 MED ORDER — NIFEDIPINE 10 MG PO CAPS
10.0000 mg | ORAL_CAPSULE | Freq: Once | ORAL | Status: AC
  Administered 2014-08-08: 10 mg via ORAL
  Filled 2014-08-08: qty 1

## 2014-08-08 MED ORDER — NIFEDIPINE 10 MG PO CAPS: 10.0000 mg | ORAL_CAPSULE | Freq: Four times a day (QID) | ORAL | Status: DC | PRN

## 2014-08-08 NOTE — MAU Provider Note (Addendum)
History     CSN: [id number]  Arrival date and time: 08/08/14 2024 Provider notified: 2051 Provider on unit: 2140 Provider at bedside: 2145     Chief Complaint  Patient presents with  . Contractions   HPI Comments: Patient seen and examined. PTL precautions discussed. Procardia use and side effects discussed. NST reviewed   Ms. Tina Hart is a 38 yo G2P1001 at 31.[redacted] wks gestation presenting tonight with complaints of contractions all day that seemed to worsen around [place].  She denies abnormal vaginal discharge, VB or LOF.  (+) FM.  She has not had any problems with PTL during this pregnancy.  Her primary OB provider at [facility] is Dr. Ronita Hipps.  Past Medical History  Diagnosis Date  . Hyperthyroidism     Past Surgical History  Procedure Laterality Date  . Cesarean section      No family history on file.  History  Substance Use Topics  . Smoking status: Never Smoker   . Smokeless tobacco: Not on file  . Alcohol Use: No    Allergies:  Allergies  Allergen Reactions  . Erythromycin Other (See Comments)    Pt states that this makes her stomach upset.   . Sulfonamide Derivatives Other (See Comments)    Pt states that she got welps all over her skin.     No prescriptions prior to admission    Review of Systems  Constitutional: Negative.   HENT: Negative.   Respiratory: Negative.   Cardiovascular: Negative.   Gastrointestinal: Positive for nausea.       Nausea only with lying back in the bed  Genitourinary:       Frequent UC's  Musculoskeletal: Negative.   Skin: Negative.   Neurological: Negative.   Endo/Heme/Allergies: Negative.   Psychiatric/Behavioral: Negative.    CEFM:  FHR: 145 / moderate variability / accels present / variables Toco: regular every 3-4 mins  Results for orders placed during the hospital encounter of 08/08/14 (from the past 24 hour(s))  URINALYSIS, ROUTINE W REFLEX MICROSCOPIC     Status: Abnormal   Collection Time     08/08/14  8:30 PM      Result Value Ref Range   Color, Urine YELLOW  YELLOW   APPearance CLEAR  CLEAR   Specific Gravity, Urine <1.005 (*) 1.005 - 1.030   pH 6.5  5.0 - 8.0   Glucose, UA NEGATIVE  NEGATIVE mg/dL   Hgb urine dipstick NEGATIVE  NEGATIVE   Bilirubin Urine NEGATIVE  NEGATIVE   Ketones, ur 15 (*) NEGATIVE mg/dL   Protein, ur NEGATIVE  NEGATIVE mg/dL   Urobilinogen, UA 0.2  0.0 - 1.0 mg/dL   Nitrite NEGATIVE  NEGATIVE   Leukocytes, UA NEGATIVE  NEGATIVE  FETAL FIBRONECTIN     Status: None   Collection Time    08/08/14 10:00 PM      Result Value Ref Range   Fetal Fibronectin NEGATIVE  NEGATIVE   Physical Exam   Blood pressure 136/72, pulse 72, temperature 97.8 F (36.6 C), temperature source Oral, resp. rate 18.  Physical Exam  Constitutional: She is oriented to person, place, and time. She appears well-developed and well-nourished.  HENT:  Head: Normocephalic and atraumatic.  Eyes: Pupils are equal, round, and reactive to light.  Neck: Normal range of motion. Neck supple.  Cardiovascular: Normal rate, regular rhythm, normal heart sounds and intact distal pulses.   Respiratory: Effort normal and breath sounds normal.  GI: Soft. Bowel sounds are normal.  Genitourinary:  S=D, SSE: normal white, creamy vaginal d/c, fFN obtained, VE closed/thick/high/posterior  Musculoskeletal: Normal range of motion.  Neurological: She is alert and oriented to person, place, and time. She has normal reflexes.  Skin: Skin is warm and dry.  Psychiatric: She has a normal mood and affect. Her behavior is normal. Judgment and thought content normal.    MAU Course  Procedures CEFM CCUA fFN LR 1 liter bolus Procardia 10 mg po x 1  Assessment and Plan  38 yo G2P1001 @ 31.[redacted] wks gestation Preterm Contractions fFN - Negative Category 1 FHR tracing  Discharge home Procardia 10 mg every 6 hours prn contractions PO Hydration Increase Rest Keep OB appt with Dr. Ronita Hipps on Monday  9/28 Call the office with any further questions, problems or concerns  Patient seen and examined. PTL precautions discussed. Procardia use and side effects discussed. NST reviewed.  Laury Deep, M MSN, CNM 08/08/2014, 11:56 PM

## 2014-08-08 NOTE — MAU Note (Signed)
Out eating and noticed strong contractions, denies leaking fluid, denies vaginal bleeding.  + FM

## 2014-09-14 ENCOUNTER — Encounter (HOSPITAL_COMMUNITY): Payer: Self-pay | Admitting: *Deleted

## 2014-09-28 ENCOUNTER — Other Ambulatory Visit: Payer: Self-pay | Admitting: Obstetrics and Gynecology

## 2014-09-30 ENCOUNTER — Encounter (HOSPITAL_COMMUNITY)
Admission: RE | Admit: 2014-09-30 | Discharge: 2014-09-30 | Disposition: A | Discharge status: Home / Self Care | Payer: BC Managed Care – PPO | Source: Ambulatory Visit | Attending: Obstetrics and Gynecology | Admitting: Obstetrics and Gynecology

## 2014-09-30 ENCOUNTER — Encounter (HOSPITAL_COMMUNITY): Payer: Self-pay

## 2014-09-30 HISTORY — DX: Headache, unspecified: R51.9

## 2014-09-30 HISTORY — DX: Anxiety disorder, unspecified: F41.9

## 2014-09-30 HISTORY — DX: Gastro-esophageal reflux disease without esophagitis: K21.9

## 2014-09-30 HISTORY — DX: Postpartum depression: F53.0

## 2014-09-30 HISTORY — DX: Headache: R51

## 2014-09-30 HISTORY — DX: Other mental disorders complicating the puerperium: O99.345

## 2014-09-30 LAB — BASIC METABOLIC PANEL
Anion gap: 10 (ref 5–15)
BUN: 7 mg/dL (ref 6–23)
CHLORIDE: 98 meq/L (ref 96–112)
CO2: 23 meq/L (ref 19–32)
Calcium: 8.6 mg/dL (ref 8.4–10.5)
Creatinine, Ser: 0.54 mg/dL (ref 0.50–1.10)
GFR calc Af Amer: >90 mL/min (ref >90)
GFR calc non Af Amer: >90 mL/min (ref >90)
GLUCOSE: 76 mg/dL (ref 70–99)
POTASSIUM: 4.6 meq/L (ref 3.7–5.3)
SODIUM: 131 meq/L — AB (ref 137–147)

## 2014-09-30 LAB — CBC
HEMATOCRIT: 34.6 % — AB (ref 36.0–46.0)
HEMOGLOBIN: 11.3 g/dL — AB (ref 12.0–15.0)
MCH: 28.1 pg (ref 26.0–34.0)
MCHC: 32.7 g/dL (ref 30.0–36.0)
MCV: 86.1 fL (ref 78.0–100.0)
Platelets: 229 10*3/uL (ref 150–400)
RBC: 4.02 MIL/uL (ref 3.87–5.11)
RDW: 15 % (ref 11.5–15.5)
WBC: 7.7 10*3/uL (ref 4.0–10.5)

## 2014-09-30 LAB — RPR

## 2014-09-30 LAB — TYPE AND SCREEN
ABO/RH(D): A POS
Antibody Screen: NEGATIVE

## 2014-09-30 LAB — ABO/RH: ABO/RH(D): A POS

## 2014-09-30 NOTE — Patient Instructions (Addendum)
Your procedure is scheduled on:  Friday, Nov. 20, 2015  Enter through the Main Entrance of [facility] at:  8:00 a.m.  Pick up the phone at the desk and dial [phone].  Call this number if you have problems the morning of surgery: [phone].  Remember: Do NOT eat food:  AFTER MIDNIGHT THURSDAY Do NOT drink clear liquids after: AFTER MIDNIGHT THURSDAY Take these medicines the morning of surgery with a SIP OF WATER: LEVOTHYROXINE, ZANTAC  Do NOT wear jewelry (body piercing), metal hair clips/bobby pins, or nail polish. Do NOT wear lotions, powders, or perfumes.  You may wear deoderant. Do NOT shave for 48 hours prior to surgery. Do NOT bring valuables to the hospital. Leave suitcase in car.  After surgery it may be brought to your room.  For patients admitted to the hospital, checkout time is 11:00 AM the day of discharge.

## 2014-10-01 NOTE — H&P (Signed)
Tina Hart is a 38 y.o. female presenting for repeat Cesarean Section.  Maternal Medical History:  Contractions: Frequency: rare.    Fetal activity: Perceived fetal activity is normal.   Last perceived fetal movement was within the past hour.    Prenatal complications: no prenatal complications Prenatal Complications - Diabetes: none.    OB History    Gravida Para Term Preterm AB TAB SAB Ectopic Multiple Living   2 1 1  0 0 0 0 0 0 1     Past Medical History  Diagnosis Date  . Hyperthyroidism   . Anxiety   . GERD (gastroesophageal reflux disease)     WITH PREGNANCY  . Headache   . Post partum depression    Past Surgical History  Procedure Laterality Date  . Cesarean section    . Wisdom tooth extraction    . Colonoscopy     Family History: family history is not on file. Social History:  reports that she has never smoked. She has never used smokeless tobacco. She reports that she drinks alcohol. She reports that she does not use illicit drugs.   Prenatal Transfer Tool  Maternal Diabetes: No Genetic Screening: Normal Maternal Ultrasounds/Referrals: Normal Fetal Ultrasounds or other Referrals:  None Maternal Substance Abuse:  No Significant Maternal Medications:  None Significant Maternal Lab Results:  None Other Comments:  None  Review of Systems  All other systems reviewed and are negative.     Last menstrual period 01/02/2014. Maternal Exam:  Uterine Assessment: Contraction strength is mild.  Contraction frequency is rare.   Abdomen: Patient reports no abdominal tenderness. Surgical scars: low transverse.   Fetal presentation: vertex  Introitus: Normal vulva. Normal vagina.  Ferning test: not done.  Nitrazine test: not done. Amniotic fluid character: not assessed.  Pelvis: questionable for delivery.      Physical Exam  Vitals reviewed. Constitutional: She is oriented to person, place, and time. She appears well-developed and well-nourished.   HENT:  Head: Normocephalic.  Neck: Normal range of motion. Neck supple.  Cardiovascular: Normal rate and regular rhythm.   Respiratory: Effort normal and breath sounds normal.  GI: Soft. Bowel sounds are normal.  Genitourinary: Vagina normal.  Musculoskeletal: Normal range of motion.  Neurological: She is alert and oriented to person, place, and time. No cranial nerve deficit.  Skin: Skin is warm and dry.  Psychiatric: She has a normal mood and affect. Her behavior is normal. Thought content normal.    Prenatal labs: ABO, Rh: --/--/A POS, A POS (11/18 0947) Antibody: NEG (11/18 0947) Rubella: Immune (04/23 0000) RPR: NON REAC (11/18 0947)  HBsAg: Negative (04/23 0000)  HIV: Non-reactive (04/23 0000)  GBS:     Assessment/Plan: Term IUP Polyhydramnios Rpt Csection Risks of surgery discussed. Consent done.   Vieva Brummitt J 10/01/2014, 10:34 PM

## 2014-10-02 ENCOUNTER — Inpatient Hospital Stay (HOSPITAL_COMMUNITY): Payer: BC Managed Care – PPO | Admitting: Anesthesiology

## 2014-10-02 ENCOUNTER — Inpatient Hospital Stay (HOSPITAL_COMMUNITY)
Admission: RE | Admit: 2014-10-02 | Discharge: 2014-10-04 | DRG: 765 | Disposition: A | Discharge status: Home / Self Care | Payer: BC Managed Care – PPO | Source: Ambulatory Visit | Attending: Obstetrics and Gynecology | Admitting: Obstetrics and Gynecology

## 2014-10-02 ENCOUNTER — Encounter (HOSPITAL_COMMUNITY): Payer: Self-pay | Admitting: *Deleted

## 2014-10-02 ENCOUNTER — Encounter (HOSPITAL_COMMUNITY)
Admission: RE | Disposition: A | Discharge status: Home / Self Care | Payer: Self-pay | Source: Ambulatory Visit | Attending: Obstetrics and Gynecology

## 2014-10-02 DIAGNOSIS — O403XX0 Polyhydramnios, third trimester, not applicable or unspecified: Secondary | ICD-10-CM | POA: Diagnosis present

## 2014-10-02 DIAGNOSIS — O3421 Maternal care for scar from previous cesarean delivery: Secondary | ICD-10-CM | POA: Diagnosis present

## 2014-10-02 DIAGNOSIS — O99284 Endocrine, nutritional and metabolic diseases complicating childbirth: Secondary | ICD-10-CM | POA: Diagnosis present

## 2014-10-02 DIAGNOSIS — K219 Gastro-esophageal reflux disease without esophagitis: Secondary | ICD-10-CM | POA: Diagnosis present

## 2014-10-02 DIAGNOSIS — O9902 Anemia complicating childbirth: Secondary | ICD-10-CM | POA: Diagnosis present

## 2014-10-02 DIAGNOSIS — D62 Acute posthemorrhagic anemia: Secondary | ICD-10-CM | POA: Diagnosis present

## 2014-10-02 DIAGNOSIS — F329 Major depressive disorder, single episode, unspecified: Secondary | ICD-10-CM | POA: Diagnosis present

## 2014-10-02 DIAGNOSIS — E039 Hypothyroidism, unspecified: Secondary | ICD-10-CM | POA: Diagnosis present

## 2014-10-02 DIAGNOSIS — O99343 Other mental disorders complicating pregnancy, third trimester: Secondary | ICD-10-CM | POA: Diagnosis present

## 2014-10-02 DIAGNOSIS — O99613 Diseases of the digestive system complicating pregnancy, third trimester: Secondary | ICD-10-CM | POA: Diagnosis present

## 2014-10-02 DIAGNOSIS — Z3A39 39 weeks gestation of pregnancy: Secondary | ICD-10-CM | POA: Diagnosis present

## 2014-10-02 SURGERY — Surgical Case
Anesthesia: Spinal | Laterality: Bilateral

## 2014-10-02 MED ORDER — MIDAZOLAM HCL 2 MG/2ML IJ SOLN: 0.5000 mg | Freq: Once | INTRAMUSCULAR | Status: DC | PRN

## 2014-10-02 MED ORDER — ZOLPIDEM TARTRATE 5 MG PO TABS: 5.0000 mg | ORAL_TABLET | Freq: Every evening | ORAL | Status: DC | PRN

## 2014-10-02 MED ORDER — DIPHENHYDRAMINE HCL 25 MG PO CAPS
25.0000 mg | ORAL_CAPSULE | ORAL | Status: DC | PRN
  Filled 2014-10-02: qty 1

## 2014-10-02 MED ORDER — MEPERIDINE HCL 25 MG/ML IJ SOLN: 6.2500 mg | INTRAMUSCULAR | Status: DC | PRN

## 2014-10-02 MED ORDER — KETOROLAC TROMETHAMINE 30 MG/ML IJ SOLN: 15.0000 mg | Freq: Once | INTRAMUSCULAR | Status: DC | PRN

## 2014-10-02 MED ORDER — PHENYLEPHRINE 8 MG IN D5W 100 ML (0.08MG/ML) PREMIX OPTIME
INJECTION | INTRAVENOUS | Status: AC
  Filled 2014-10-02: qty 100

## 2014-10-02 MED ORDER — ONDANSETRON HCL 4 MG/2ML IJ SOLN: 4.0000 mg | INTRAMUSCULAR | Status: DC | PRN

## 2014-10-02 MED ORDER — EPHEDRINE SULFATE 50 MG/ML IJ SOLN
INTRAMUSCULAR | Status: DC | PRN
  Administered 2014-10-02: 10 mg via INTRAVENOUS

## 2014-10-02 MED ORDER — DIPHENHYDRAMINE HCL 25 MG PO CAPS: 25.0000 mg | ORAL_CAPSULE | Freq: Four times a day (QID) | ORAL | Status: DC | PRN

## 2014-10-02 MED ORDER — IBUPROFEN 600 MG PO TABS
600.0000 mg | ORAL_TABLET | Freq: Four times a day (QID) | ORAL | Status: DC | PRN
  Administered 2014-10-04: 600 mg via ORAL

## 2014-10-02 MED ORDER — SODIUM CHLORIDE 0.9 % IJ SOLN: 3.0000 mL | INTRAMUSCULAR | Status: DC | PRN

## 2014-10-02 MED ORDER — OXYCODONE-ACETAMINOPHEN 5-325 MG PO TABS
1.0000 | ORAL_TABLET | ORAL | Status: DC | PRN
  Administered 2014-10-03 – 2014-10-04 (×8): 1 via ORAL
  Filled 2014-10-02 (×8): qty 1

## 2014-10-02 MED ORDER — OXYTOCIN 40 UNITS IN LACTATED RINGERS INFUSION - SIMPLE MED
INTRAVENOUS | Status: DC | PRN
  Administered 2014-10-02: 40 [IU] via INTRAVENOUS

## 2014-10-02 MED ORDER — LEVOTHYROXINE SODIUM 75 MCG PO TABS
75.0000 ug | ORAL_TABLET | Freq: Every day | ORAL | Status: DC
  Administered 2014-10-03 – 2014-10-04 (×2): 75 ug via ORAL
  Filled 2014-10-02 (×3): qty 1

## 2014-10-02 MED ORDER — ONDANSETRON HCL 4 MG/2ML IJ SOLN: 4.0000 mg | Freq: Three times a day (TID) | INTRAMUSCULAR | Status: DC | PRN

## 2014-10-02 MED ORDER — KETOROLAC TROMETHAMINE 30 MG/ML IJ SOLN
30.0000 mg | Freq: Four times a day (QID) | INTRAMUSCULAR | Status: AC | PRN
  Administered 2014-10-02: 30 mg via INTRAMUSCULAR

## 2014-10-02 MED ORDER — PHENYLEPHRINE 8 MG IN D5W 100 ML (0.08MG/ML) PREMIX OPTIME
INJECTION | INTRAVENOUS | Status: DC | PRN
  Administered 2014-10-02: 60 ug/min via INTRAVENOUS

## 2014-10-02 MED ORDER — ONDANSETRON HCL 4 MG/2ML IJ SOLN
INTRAMUSCULAR | Status: AC
  Filled 2014-10-02: qty 2

## 2014-10-02 MED ORDER — WITCH HAZEL-GLYCERIN EX PADS: 1.0000 "application " | MEDICATED_PAD | CUTANEOUS | Status: DC | PRN

## 2014-10-02 MED ORDER — SODIUM CHLORIDE 0.9 % IR SOLN
Status: DC | PRN
  Administered 2014-10-02: 1000 mL

## 2014-10-02 MED ORDER — FENTANYL CITRATE 0.05 MG/ML IJ SOLN
INTRAMUSCULAR | Status: DC | PRN
  Administered 2014-10-02: 25 ug via INTRATHECAL

## 2014-10-02 MED ORDER — PRENATAL MULTIVITAMIN CH
1.0000 | ORAL_TABLET | Freq: Every day | ORAL | Status: DC
  Administered 2014-10-03 – 2014-10-04 (×2): 1 via ORAL
  Filled 2014-10-02 (×2): qty 1

## 2014-10-02 MED ORDER — OXYCODONE-ACETAMINOPHEN 5-325 MG PO TABS: 2.0000 | ORAL_TABLET | ORAL | Status: DC | PRN

## 2014-10-02 MED ORDER — SIMETHICONE 80 MG PO CHEW: 80.0000 mg | CHEWABLE_TABLET | ORAL | Status: DC | PRN

## 2014-10-02 MED ORDER — ONDANSETRON HCL 4 MG/2ML IJ SOLN
INTRAMUSCULAR | Status: DC | PRN
  Administered 2014-10-02: 4 mg via INTRAVENOUS

## 2014-10-02 MED ORDER — LACTATED RINGERS IV SOLN
Freq: Once | INTRAVENOUS | Status: AC
  Administered 2014-10-02: 09:00:00 via INTRAVENOUS

## 2014-10-02 MED ORDER — LANOLIN HYDROUS EX OINT
1.0000 | TOPICAL_OINTMENT | CUTANEOUS | Status: DC | PRN
Start: 2014-10-02 — End: 2014-10-04

## 2014-10-02 MED ORDER — MORPHINE SULFATE (PF) 0.5 MG/ML IJ SOLN
INTRAMUSCULAR | Status: DC | PRN
  Administered 2014-10-02: .15 mg via INTRATHECAL

## 2014-10-02 MED ORDER — PHENYLEPHRINE HCL 10 MG/ML IJ SOLN
INTRAMUSCULAR | Status: DC | PRN
  Administered 2014-10-02: 80 ug via INTRAVENOUS

## 2014-10-02 MED ORDER — CEFAZOLIN SODIUM-DEXTROSE 2-3 GM-% IV SOLR
2.0000 g | INTRAVENOUS | Status: AC
  Administered 2014-10-02: 2 g via INTRAVENOUS

## 2014-10-02 MED ORDER — SCOPOLAMINE 1 MG/3DAYS TD PT72: 1.0000 | MEDICATED_PATCH | Freq: Once | TRANSDERMAL | Status: DC

## 2014-10-02 MED ORDER — OXYTOCIN 10 UNIT/ML IJ SOLN
INTRAMUSCULAR | Status: AC
  Filled 2014-10-02: qty 4

## 2014-10-02 MED ORDER — SIMETHICONE 80 MG PO CHEW
80.0000 mg | CHEWABLE_TABLET | ORAL | Status: DC
  Administered 2014-10-03 (×2): 80 mg via ORAL
  Filled 2014-10-02 (×2): qty 1

## 2014-10-02 MED ORDER — FENTANYL CITRATE 0.05 MG/ML IJ SOLN
INTRAMUSCULAR | Status: AC
  Filled 2014-10-02: qty 2

## 2014-10-02 MED ORDER — FENTANYL CITRATE 0.05 MG/ML IJ SOLN: 25.0000 ug | INTRAMUSCULAR | Status: DC | PRN

## 2014-10-02 MED ORDER — CEFAZOLIN SODIUM-DEXTROSE 2-3 GM-% IV SOLR
INTRAVENOUS | Status: AC
  Filled 2014-10-02: qty 50

## 2014-10-02 MED ORDER — KETOROLAC TROMETHAMINE 30 MG/ML IJ SOLN: 30.0000 mg | Freq: Four times a day (QID) | INTRAMUSCULAR | Status: DC | PRN

## 2014-10-02 MED ORDER — DIPHENHYDRAMINE HCL 50 MG/ML IJ SOLN: 12.5000 mg | INTRAMUSCULAR | Status: DC | PRN

## 2014-10-02 MED ORDER — NALOXONE HCL 1 MG/ML IJ SOLN
1.0000 ug/kg/h | INTRAVENOUS | Status: DC | PRN
  Filled 2014-10-02: qty 2

## 2014-10-02 MED ORDER — KETOROLAC TROMETHAMINE 30 MG/ML IJ SOLN
INTRAMUSCULAR | Status: AC
  Administered 2014-10-02: 30 mg via INTRAMUSCULAR
  Filled 2014-10-02: qty 1

## 2014-10-02 MED ORDER — TETANUS-DIPHTH-ACELL PERTUSSIS 5-2.5-18.5 LF-MCG/0.5 IM SUSP: 0.5000 mL | Freq: Once | INTRAMUSCULAR | Status: DC

## 2014-10-02 MED ORDER — SENNOSIDES-DOCUSATE SODIUM 8.6-50 MG PO TABS
2.0000 | ORAL_TABLET | ORAL | Status: DC
  Administered 2014-10-03 (×2): 2 via ORAL
  Filled 2014-10-02 (×2): qty 2

## 2014-10-02 MED ORDER — SCOPOLAMINE 1 MG/3DAYS TD PT72
MEDICATED_PATCH | TRANSDERMAL | Status: AC
  Administered 2014-10-02: 1.5 mg via TRANSDERMAL
  Filled 2014-10-02: qty 1

## 2014-10-02 MED ORDER — METHYLERGONOVINE MALEATE 0.2 MG PO TABS: 0.2000 mg | ORAL_TABLET | ORAL | Status: DC | PRN

## 2014-10-02 MED ORDER — SERTRALINE HCL 50 MG PO TABS
50.0000 mg | ORAL_TABLET | Freq: Every day | ORAL | Status: DC
Start: 2014-10-02 — End: 2014-10-04
  Administered 2014-10-03 – 2014-10-04 (×2): 50 mg via ORAL
  Filled 2014-10-02 (×3): qty 1

## 2014-10-02 MED ORDER — EPHEDRINE 5 MG/ML INJ
INTRAVENOUS | Status: AC
  Filled 2014-10-02: qty 10

## 2014-10-02 MED ORDER — PROMETHAZINE HCL 25 MG/ML IJ SOLN: 6.2500 mg | INTRAMUSCULAR | Status: DC | PRN

## 2014-10-02 MED ORDER — ACETAMINOPHEN 500 MG PO TABS
1000.0000 mg | ORAL_TABLET | Freq: Four times a day (QID) | ORAL | Status: DC
Start: 2014-10-02 — End: 2014-10-03
  Administered 2014-10-02: 1000 mg via ORAL
  Filled 2014-10-02: qty 2

## 2014-10-02 MED ORDER — LACTATED RINGERS IV SOLN: INTRAVENOUS | Status: DC

## 2014-10-02 MED ORDER — LACTATED RINGERS IV SOLN
INTRAVENOUS | Status: DC | PRN
  Administered 2014-10-02 (×3): via INTRAVENOUS

## 2014-10-02 MED ORDER — IBUPROFEN 600 MG PO TABS
600.0000 mg | ORAL_TABLET | Freq: Four times a day (QID) | ORAL | Status: DC
  Administered 2014-10-02 – 2014-10-04 (×6): 600 mg via ORAL
  Filled 2014-10-02 (×7): qty 1

## 2014-10-02 MED ORDER — NALBUPHINE HCL 10 MG/ML IJ SOLN: 5.0000 mg | Freq: Once | INTRAMUSCULAR | Status: AC | PRN

## 2014-10-02 MED ORDER — BUPIVACAINE HCL (PF) 0.25 % IJ SOLN
INTRAMUSCULAR | Status: DC | PRN
  Administered 2014-10-02: 10 mL

## 2014-10-02 MED ORDER — METHYLERGONOVINE MALEATE 0.2 MG/ML IJ SOLN: 0.2000 mg | INTRAMUSCULAR | Status: DC | PRN

## 2014-10-02 MED ORDER — DEXAMETHASONE SODIUM PHOSPHATE 10 MG/ML IJ SOLN
INTRAMUSCULAR | Status: DC | PRN
  Administered 2014-10-02: 6 mg via INTRAVENOUS

## 2014-10-02 MED ORDER — SCOPOLAMINE 1 MG/3DAYS TD PT72
1.0000 | MEDICATED_PATCH | Freq: Once | TRANSDERMAL | Status: DC
  Administered 2014-10-02: 1.5 mg via TRANSDERMAL

## 2014-10-02 MED ORDER — NALOXONE HCL 0.4 MG/ML IJ SOLN: 0.4000 mg | INTRAMUSCULAR | Status: DC | PRN

## 2014-10-02 MED ORDER — NALBUPHINE HCL 10 MG/ML IJ SOLN
INTRAMUSCULAR | Status: AC
  Filled 2014-10-02: qty 1

## 2014-10-02 MED ORDER — NALBUPHINE HCL 10 MG/ML IJ SOLN
5.0000 mg | Freq: Once | INTRAMUSCULAR | Status: AC | PRN
  Administered 2014-10-02: 5 mg via SUBCUTANEOUS

## 2014-10-02 MED ORDER — MORPHINE SULFATE 0.5 MG/ML IJ SOLN
INTRAMUSCULAR | Status: AC
  Filled 2014-10-02: qty 10

## 2014-10-02 MED ORDER — SODIUM CHLORIDE 0.9 % IJ SOLN
INTRAMUSCULAR | Status: AC
  Filled 2014-10-02: qty 10

## 2014-10-02 MED ORDER — MENTHOL 3 MG MT LOZG
1.0000 | LOZENGE | OROMUCOSAL | Status: DC | PRN
  Filled 2014-10-02: qty 9

## 2014-10-02 MED ORDER — OXYTOCIN 40 UNITS IN LACTATED RINGERS INFUSION - SIMPLE MED: 62.5000 mL/h | INTRAVENOUS | Status: DC

## 2014-10-02 MED ORDER — PHENYLEPHRINE 40 MCG/ML (10ML) SYRINGE FOR IV PUSH (FOR BLOOD PRESSURE SUPPORT)
PREFILLED_SYRINGE | INTRAVENOUS | Status: AC
  Filled 2014-10-02: qty 5

## 2014-10-02 MED ORDER — SIMETHICONE 80 MG PO CHEW
80.0000 mg | CHEWABLE_TABLET | Freq: Three times a day (TID) | ORAL | Status: DC
  Administered 2014-10-02 – 2014-10-04 (×6): 80 mg via ORAL
  Filled 2014-10-02 (×6): qty 1

## 2014-10-02 MED ORDER — DEXAMETHASONE SODIUM PHOSPHATE 10 MG/ML IJ SOLN
INTRAMUSCULAR | Status: AC
  Filled 2014-10-02: qty 1

## 2014-10-02 MED ORDER — BUPIVACAINE IN DEXTROSE 0.75-8.25 % IT SOLN
INTRATHECAL | Status: DC | PRN
  Administered 2014-10-02: 1.5 mL via INTRATHECAL

## 2014-10-02 MED ORDER — DIBUCAINE 1 % RE OINT: 1.0000 "application " | TOPICAL_OINTMENT | RECTAL | Status: DC | PRN

## 2014-10-02 MED ORDER — NALBUPHINE HCL 10 MG/ML IJ SOLN: 5.0000 mg | INTRAMUSCULAR | Status: DC | PRN

## 2014-10-02 MED ORDER — ONDANSETRON HCL 4 MG PO TABS: 4.0000 mg | ORAL_TABLET | ORAL | Status: DC | PRN

## 2014-10-02 MED ORDER — BUPIVACAINE HCL (PF) 0.25 % IJ SOLN
INTRAMUSCULAR | Status: AC
  Filled 2014-10-02: qty 10

## 2014-10-02 SURGICAL SUPPLY — 36 items
CLAMP CORD UMBIL (MISCELLANEOUS) IMPLANT
CLOTH BEACON ORANGE TIMEOUT ST (SAFETY) ×3 IMPLANT
CONTAINER PREFILL 10% NBF 15ML (MISCELLANEOUS) IMPLANT
DRAPE SHEET LG 3/4 BI-LAMINATE (DRAPES) IMPLANT
DRSG OPSITE POSTOP 4X10 (GAUZE/BANDAGES/DRESSINGS) ×3 IMPLANT
DURAPREP 26ML APPLICATOR (WOUND CARE) ×3 IMPLANT
ELECT REM PT RETURN 9FT ADLT (ELECTROSURGICAL) ×3
ELECTRODE REM PT RTRN 9FT ADLT (ELECTROSURGICAL) ×1 IMPLANT
EXTRACTOR VACUUM M CUP 4 TUBE (SUCTIONS) IMPLANT
EXTRACTOR VACUUM M CUP 4' TUBE (SUCTIONS)
GAUZE SPONGE 4X4 12PLY STRL (GAUZE/BANDAGES/DRESSINGS) ×3 IMPLANT
GLOVE BIO SURGEON STRL SZ7.5 (GLOVE) ×3 IMPLANT
GOWN STRL REUS W/TWL LRG LVL3 (GOWN DISPOSABLE) ×6 IMPLANT
KIT ABG SYR 3ML LUER SLIP (SYRINGE) IMPLANT
NEEDLE HYPO 25X1 1.5 SAFETY (NEEDLE) ×3 IMPLANT
NEEDLE HYPO 25X5/8 SAFETYGLIDE (NEEDLE) IMPLANT
NEEDLE SPNL 20GX3.5 QUINCKE YW (NEEDLE) IMPLANT
NS IRRIG 1000ML POUR BTL (IV SOLUTION) ×3 IMPLANT
PACK C SECTION WH (CUSTOM PROCEDURE TRAY) ×3 IMPLANT
PAD ABD 8X7 1/2 STERILE (GAUZE/BANDAGES/DRESSINGS) ×3 IMPLANT
PAD OB MATERNITY 4.3X12.25 (PERSONAL CARE ITEMS) ×3 IMPLANT
STAPLER VISISTAT 35W (STAPLE) IMPLANT
SUT MNCRL 0 VIOLET CTX 36 (SUTURE) ×2 IMPLANT
SUT MNCRL AB 3-0 PS2 27 (SUTURE) IMPLANT
SUT MON AB 2-0 CT1 27 (SUTURE) ×3 IMPLANT
SUT MON AB-0 CT1 36 (SUTURE) ×6 IMPLANT
SUT MONOCRYL 0 CTX 36 (SUTURE) ×4
SUT PLAIN 0 NONE (SUTURE) ×3 IMPLANT
SUT PLAIN 2 0 (SUTURE)
SUT PLAIN 2 0 XLH (SUTURE) ×3 IMPLANT
SUT PLAIN ABS 2-0 CT1 27XMFL (SUTURE) IMPLANT
SYR 20CC LL (SYRINGE) IMPLANT
SYR CONTROL 10ML LL (SYRINGE) ×3 IMPLANT
TOWEL OR 17X24 6PK STRL BLUE (TOWEL DISPOSABLE) ×3 IMPLANT
TRAY FOLEY CATH 14FR (SET/KITS/TRAYS/PACK) ×3 IMPLANT
WATER STERILE IRR 1000ML POUR (IV SOLUTION) ×3 IMPLANT

## 2014-10-02 NOTE — Plan of Care (Signed)
Problem: Phase I Progression Outcomes Goal: IS, TCDB as ordered Outcome: Progressing Pt. Instructed, but declines use at this time

## 2014-10-02 NOTE — Anesthesia Procedure Notes (Signed)
Spinal Patient location during procedure: OR Start time: 10/02/2014 9:41 AM Staffing Performed by: anesthesiologist  Preanesthetic Checklist Completed: patient identified, site marked, surgical consent, pre-op evaluation, timeout performed, IV checked, risks and benefits discussed and monitors and equipment checked Spinal Block Patient position: sitting Prep: DuraPrep Patient monitoring: heart rate, cardiac monitor, continuous pulse ox and blood pressure Approach: midline Location: L3-4 Injection technique: single-shot Needle Needle type: Sprotte  Needle gauge: 24 G Needle length: 9 cm Assessment Sensory level: T4 Additional Notes Patient identified.  Risk benefits discussed including failed block, incomplete pain control, headache, nerve damage, paralysis, blood pressure changes, nausea, vomiting, reactions to medication both toxic or allergic, and postpartum back pain.  Patient expressed understanding and wished to proceed.  All questions were answered.  Sterile technique used throughout procedure.  CSF was clear.  No parasthesia or other complications.  Please see nursing notes for vital signs.

## 2014-10-02 NOTE — Progress Notes (Signed)
Patient ID: Tina Hart, female   DOB: 1976/09/08, 38 y.o.   MRN: [id number] Patient seen and examined. Consent witnessed and signed. No changes noted. Update completed. CBC    Component Value Date/Time   WBC 7.7 09/30/2014 0947   RBC 4.02 09/30/2014 0947   HGB 11.3* 09/30/2014 0947   HCT 34.6* 09/30/2014 0947   PLT 229 09/30/2014 0947   MCV 86.1 09/30/2014 0947   MCH 28.1 09/30/2014 0947   MCHC 32.7 09/30/2014 0947   RDW 15.0 09/30/2014 0947   LYMPHSABS 2.5 06/22/2008 2250   MONOABS 0.6 06/22/2008 2250   EOSABS 0.1 06/22/2008 2250   BASOSABS 0.0 06/22/2008 2250

## 2014-10-02 NOTE — Plan of Care (Signed)
Problem: Phase I Progression Outcomes Goal: Foley catheter patent Outcome: Completed/Met Date Met:  10/02/14

## 2014-10-02 NOTE — Op Note (Signed)
Cesarean Section Procedure Note  Indications: previous uterine incision Kerr x one and elective TL  Pre-operative Diagnosis: 39 week 0 day pregnancy.  Post-operative Diagnosis: same  Surgeon: Lovenia Kim   Assistants: Drake Leach, CNM  Anesthesia: Local anesthesia 0.25.% bupivacaine and Spinal anesthesia  ASA Class: 2  Procedure Details  The patient was seen in the Holding Room. The risks, benefits, complications, treatment options, and expected outcomes were discussed with the patient.  The patient concurred with the proposed plan, giving informed consent. The risks of anesthesia, infection, bleeding and possible injury to other organs discussed. Injury to bowel, bladder, or ureter with possible need for repair discussed. Possible need for transfusion with secondary risks of hepatitis or HIV acquisition discussed. Post operative complications to include but not limited to DVT, PE and Pneumonia noted. The site of surgery properly noted/marked. The patient was taken to Operating Room # 1, identified as Tina Hart and the procedure verified as C-Section Delivery. A Time Out was held and the above information confirmed.  After induction of anesthesia, the patient was draped and prepped in the usual sterile manner. A Pfannenstiel incision was made and carried down through the subcutaneous tissue to the fascia. Fascial incision was made and extended transversely using Mayo scissors. The fascia was separated from the underlying rectus tissue superiorly and inferiorly. The peritoneum was identified and entered. Peritoneal incision was extended longitudinally. The utero-vesical peritoneal reflection was incised transversely and the bladder flap was bluntly freed from the lower uterine segment. A low transverse uterine incision(Kerr hysterotomy) was made. Delivered from OT presentation with Vacuum assistance x one pull was a  female with Apgar scores of 9 at one minute and 9 at five minutes. Bulb  suctioning gently performed. Neonatal team in attendance.After the umbilical cord was clamped and cut cord blood was obtained for evaluation. The placenta was removed intact and appeared normal. The uterus was curetted with a dry lap pack. Good hemostasis was noted.The uterine outline, tubes and ovaries appeared normal. The uterine incision was closed with running locked sutures of 0 Monocryl x 2 layers. Bilateral tubal ligation with Modified Pomeroy method.  Hemostasis was observed. The parietal peritoneum was closed with a running 2-0 Monocryl suture. The fascia was then reapproximated with running sutures of 0 Monocryl. The skin was reapproximated with 3-0 monocryl after [place] closure with 2-0 plain.  Instrument, sponge, and needle counts were correct prior the abdominal closure and at the conclusion of the case.   Findings: FTLM, OT, 9/9. Post placenta  Estimated Blood Loss:  500         Drains: foley                 Specimens: placenta and tubal segments                 Complications:  None; patient tolerated the procedure well.         Disposition: PACU - hemodynamically stable.         Condition: stable  Attending Attestation: I performed the procedure.

## 2014-10-02 NOTE — Transfer of Care (Signed)
Immediate Anesthesia Transfer of Care Note  Patient: Tina Hart  Procedure(s) Performed: Procedure(s) with comments: Repeat CESAREAN SECTION WITH BILATERAL TUBAL LIGATION (Bilateral) - EDD: 10/09/14  Patient Location: PACU  Anesthesia Type:Spinal  Level of Consciousness: awake, alert  and oriented  Airway & Oxygen Therapy: Patient Spontanous Breathing  Post-op Assessment: Report given to PACU RN and Post -op Vital signs reviewed and stable  Post vital signs: Reviewed and stable  Complications: No apparent anesthesia complications

## 2014-10-02 NOTE — Progress Notes (Signed)
MOB was referred for history of depression/anxiety.  Referral screened out by Clinical Social Worker because none of the following criteria appear to apply: -History of anxiety/depression during this pregnancy, or of post-partum depression. - Diagnosis of anxiety and/or depression within last 3 years - History of depression due to pregnancy loss/loss of child  OR  -MOB's symptoms currently being treated with medication and/or therapy. CSW completed chart review and the MOB is currently prescribed Zoloft.   Please contact the Clinical Social Worker if needs arise or upon MOB request.

## 2014-10-02 NOTE — Plan of Care (Signed)
Problem: Phase I Progression Outcomes Goal: Initial discharge plan identified Outcome: Completed/Met Date Met:  10/02/14     

## 2014-10-02 NOTE — Anesthesia Preprocedure Evaluation (Addendum)
Anesthesia Evaluation  Patient identified by MRN, date of birth, ID band Patient awake    Reviewed: Allergy & Precautions, H&P , NPO status , Patient's Chart, lab work & pertinent test results  Airway Mallampati: III       Dental   Pulmonary  breath sounds clear to auscultation        Cardiovascular Exercise Tolerance: Good Rhythm:regular Rate:Normal     Neuro/Psych  Headaches, PSYCHIATRIC DISORDERS Anxiety Depression    GI/Hepatic GERD-  ,  Endo/Other  Hypothyroidism Hyperthyroidism   Renal/GU      Musculoskeletal   Abdominal   Peds  Hematology   Anesthesia Other Findings   Reproductive/Obstetrics (+) Pregnancy                             Anesthesia Physical Anesthesia Plan  ASA: II  Anesthesia Plan: Spinal   Post-op Pain Management:    Induction:   Airway Management Planned:   Additional Equipment:   Intra-op Plan:   Post-operative Plan:   Informed Consent: I have reviewed the patients History and Physical, chart, labs and discussed the procedure including the risks, benefits and alternatives for the proposed anesthesia with the patient or authorized representative who has indicated his/her understanding and acceptance.     Plan Discussed with: Anesthesiologist, CRNA and Surgeon  Anesthesia Plan Comments:        Anesthesia Quick Evaluation

## 2014-10-02 NOTE — Anesthesia Postprocedure Evaluation (Signed)
  Anesthesia Post Note  Patient: Tina Hart  Procedure(s) Performed: Procedure(s) (LRB): Repeat CESAREAN SECTION WITH BILATERAL TUBAL LIGATION (Bilateral)  Anesthesia type: sab Patient location: PACU  Post pain: Pain level controlled  Post assessment: Post-op Vital signs reviewed  Last Vitals:  Filed Vitals:   10/02/14 1130  BP: 111/79  Pulse: 65  Temp:   Resp: 19    Post vital signs: Reviewed  Level of consciousness: sedated  Complications: No apparent anesthesia complications

## 2014-10-03 LAB — CBC
HCT: 26.4 % — ABNORMAL LOW (ref 36.0–46.0)
Hemoglobin: 8.4 g/dL — ABNORMAL LOW (ref 12.0–15.0)
MCH: 28 pg (ref 26.0–34.0)
MCHC: 31.8 g/dL (ref 30.0–36.0)
MCV: 88 fL (ref 78.0–100.0)
PLATELETS: 158 10*3/uL (ref 150–400)
RBC: 3 MIL/uL — ABNORMAL LOW (ref 3.87–5.11)
RDW: 15.3 % (ref 11.5–15.5)
WBC: 11 10*3/uL — ABNORMAL HIGH (ref 4.0–10.5)

## 2014-10-03 LAB — BIRTH TISSUE RECOVERY COLLECTION (PLACENTA DONATION)

## 2014-10-03 MED ORDER — POLYSACCHARIDE IRON COMPLEX 150 MG PO CAPS
150.0000 mg | ORAL_CAPSULE | Freq: Every day | ORAL | Status: DC
  Administered 2014-10-03 – 2014-10-04 (×2): 150 mg via ORAL
  Filled 2014-10-03 (×2): qty 1

## 2014-10-03 MED ORDER — MAGNESIUM OXIDE 400 (241.3 MG) MG PO TABS
400.0000 mg | ORAL_TABLET | Freq: Every day | ORAL | Status: DC
  Administered 2014-10-03 – 2014-10-04 (×2): 400 mg via ORAL
  Filled 2014-10-03 (×3): qty 1

## 2014-10-03 NOTE — Lactation Note (Signed)
This note was copied from the chart of [place]. Lactation Consultation Note  Initial visit done.  Breastfeeding consultation services and support information given to patient,  Mom states baby was sleepy but just started cluster feeding.  Reviewed basics and answered questions.  Encouraged to call with concerns/assist prn.  Mom plans to page for a latch check when baby starts to show feeding cues.  Patient Name: Tina Hart [id number] Date: 10/03/2014 Reason for consult: Initial assessment   Maternal Data    Feeding Feeding Type: Breast Fed Length of feed: 20 min  LATCH Score/Interventions Latch: Grasps breast easily, tongue down, lips flanged, rhythmical sucking.  Audible Swallowing: A few with stimulation Intervention(s): Hand expression  Type of Nipple: Everted at rest and after stimulation  Comfort (Breast/Nipple): Soft / non-tender     Hold (Positioning): No assistance needed to correctly position infant at breast.  LATCH Score: 9  Lactation Tools Discussed/Used     Consult Status Consult Status: Follow-up Date: 10/04/14 Follow-up type: In-patient    Ave Filter 10/03/2014, 11:23 AM

## 2014-10-03 NOTE — Addendum Note (Signed)
Addendum  created 10/03/14 1029 by Billie Lade, CRNA   Modules edited: Notes Section   Notes Section:  File: [id number]

## 2014-10-03 NOTE — Plan of Care (Signed)
Problem: Phase I Progression Outcomes Goal: Pain controlled with appropriate interventions Outcome: Completed/Met Date Met:  10/03/14 Goal: Voiding adequately Outcome: Completed/Met Date Met:  10/03/14 Goal: OOB as tolerated unless otherwise ordered Outcome: Completed/Met Date Met:  10/03/14 Goal: VS, stable, temp < 100.4 degrees F Outcome: Completed/Met Date Met:  10/03/14 Goal: Other Phase I Outcomes/Goals Outcome: Not Applicable Date Met:  23/95/32  Problem: Phase II Progression Outcomes Goal: Pain controlled on oral analgesia Outcome: Completed/Met Date Met:  10/03/14 Goal: Progress activity as tolerated unless otherwise ordered Outcome: Completed/Met Date Met:  10/03/14 Goal: Afebrile, VS remain stable Outcome: Completed/Met Date Met:  10/03/14 Goal: Rh isoimmunization per orders Outcome: Not Applicable Date Met:  02/33/43 Goal: Tolerating diet Outcome: Completed/Met Date Met:  10/03/14 Goal: Other Phase II Outcomes/Goals Outcome: Not Applicable Date Met:  56/86/16

## 2014-10-03 NOTE — Anesthesia Postprocedure Evaluation (Signed)
  Anesthesia Post-op Note  Anesthesia Post Note  Patient: Tina Hart  Procedure(s) Performed: Procedure(s) (LRB): Repeat CESAREAN SECTION WITH BILATERAL TUBAL LIGATION (Bilateral)  Anesthesia type: Spinal  Patient location: Mother/Baby  Post pain: Pain level controlled  Post assessment: Post-op Vital signs reviewed  Last Vitals:  Filed Vitals:   10/03/14 0919  BP: 104/66  Pulse: 74  Temp: 36.8 C  Resp: 18    Post vital signs: Reviewed  Level of consciousness: awake  Complications: No apparent anesthesia complications

## 2014-10-03 NOTE — Plan of Care (Signed)
Problem: Phase I Progression Outcomes Goal: IS, TCDB as ordered Outcome: Completed/Met Date Met:  10/03/14     

## 2014-10-03 NOTE — Plan of Care (Signed)
Problem: Phase II Progression Outcomes Goal: Incision intact & without signs/symptoms of infection Outcome: Completed/Met Date Met:  10/03/14

## 2014-10-03 NOTE — Progress Notes (Signed)
POSTOPERATIVE DAY # 1 S/P repeat C/S and BTL   S:         Reports feeling good, but sore with activity             Tolerating po intake / no nausea / no vomiting / occasional flatus / no BM             Bleeding is light             Feels more gas pains today             Pain controlled with Motrin and Percocet             Up ad lib / ambulatory/ voiding QS  Newborn breast feeding - cluster feeding now / Circumcision - planning in hospital   O:  VS: BP 104/66 mmHg  Pulse 74  Temp(Src) 98.3 F (36.8 C) (Axillary)  Resp 18  SpO2 97%  LMP 01/02/2014  Breastfeeding? Unknown   LABS:               Recent Labs  10/03/14 0720  WBC 11.0*  HGB 8.4*  PLT 158               Bloodtype: --/--/A POS, A POS (11/18 0947)  Rubella: Immune (04/23 0000)                                             I&O: Intake/Output      11/20 0701 - 11/21 0700 11/21 0701 - 11/22 0700   P.O. 120    I.V. 2525    Total Intake 2645     Urine 2230 900   Blood 800    Total Output 3030 900   Net -385 -900                     Physical Exam:             Alert and Oriented X3  Lungs: Clear and unlabored  Heart: regular rate and rhythm / no mumurs  Abdomen: soft, non-tender, non-distended, bowel sounds hypoactive in all 4 quadrants             Fundus: firm, non-tender, U-1             Dressing: honeycomb dressing intact              Incision:  approximated with sutures / no erythema / no ecchymosis / no drainage  Perineum: intact, slightly edematous over mons pubis  Lochia: small, red bleeding, no clots noted  Extremities: +1 dependent edema, no calf pain or tenderness, negative Homans bilaterally  A:        POD # 1 S/P repeat C/S and BTL            ABL Anemia - on Niferex   P:        Routine postoperative care              D/C IV today             Ambulate in halls today             Instructed to drink warm fluids and warm shower today to help with gas pains             Possible discharge tomorrow     Kassie Mends,  SNM

## 2014-10-04 MED ORDER — SERTRALINE HCL 50 MG PO TABS
75.0000 mg | ORAL_TABLET | Freq: Every day | ORAL | Status: DC
  Filled 2014-10-04: qty 1

## 2014-10-04 MED ORDER — MAGNESIUM OXIDE 400 (241.3 MG) MG PO TABS: ORAL_TABLET | ORAL | Status: DC

## 2014-10-04 MED ORDER — SERTRALINE HCL 25 MG PO TABS: 75.0000 mg | ORAL_TABLET | Freq: Every day | ORAL | Status: AC

## 2014-10-04 MED ORDER — POLYSACCHARIDE IRON COMPLEX 150 MG PO CAPS: 150.0000 mg | ORAL_CAPSULE | Freq: Every day | ORAL | Status: DC

## 2014-10-04 MED ORDER — OXYCODONE-ACETAMINOPHEN 5-325 MG PO TABS: 1.0000 | ORAL_TABLET | ORAL | Status: DC | PRN

## 2014-10-04 MED ORDER — IBUPROFEN 600 MG PO TABS: 600.0000 mg | ORAL_TABLET | Freq: Four times a day (QID) | ORAL | Status: DC | PRN

## 2014-10-04 MED ORDER — VITAMIN D 50 MCG (2000 UT) PO CAPS: 2000.0000 [IU] | ORAL_CAPSULE | Freq: Every day | ORAL | Status: DC

## 2014-10-04 NOTE — Lactation Note (Signed)
This note was copied from the chart of [place]. Lactation Consultation Note: Follow up visit with mom before DC. Mom reports that baby has been nursing well- much better that her first. Cluster fed through the night Reassurance given. Easily able to had express transitional milk. Mom reports have pp depression with her last baby. Feeling better this time. No questions at present. Reviewed BFSG and Op appointments as resources for support after DC.  Patient Name: Tina Hart Date: 10/04/2014 Reason for consult: Follow-up assessment   Maternal Data Formula Feeding for Exclusion: No Does the patient have breastfeeding experience prior to this delivery?: Yes  Feeding Feeding Type: Breast Fed Length of feed: 10 min  LATCH Score/Interventions Latch: Grasps breast easily, tongue down, lips flanged, rhythmical sucking.  Audible Swallowing: A few with stimulation  Type of Nipple: Everted at rest and after stimulation  Comfort (Breast/Nipple): Soft / non-tender     Hold (Positioning): Assistance needed to correctly position infant at breast and maintain latch. Intervention(s): Breastfeeding basics reviewed;Position options  LATCH Score: 8  Lactation Tools Discussed/Used     Consult Status Consult Status: Complete    Truddie Crumble 10/04/2014, 1:14 PM

## 2014-10-04 NOTE — Plan of Care (Signed)
Problem: Consults Goal: Postpartum Patient Education (See Patient Education module for education specifics.)  Outcome: Completed/Met Date Met:  10/04/14 Goal: Skin Care Protocol Initiated - if Braden Score 18 or less If consults are not indicated, leave blank or document N/A  Outcome: Not Applicable Date Met:  74/71/59 Goal: Nutrition Consult-if indicated Outcome: Not Applicable Date Met:  53/96/72  Problem: Discharge Progression Outcomes Goal: Barriers To Progression Addressed/Resolved Outcome: Completed/Met Date Met:  10/04/14 Goal: Activity appropriate for discharge plan Outcome: Completed/Met Date Met:  10/04/14 Goal: Tolerating diet Outcome: Completed/Met Date Met:  89/79/15 Goal: Complications resolved/controlled Outcome: Completed/Met Date Met:  10/04/14 Goal: Pain controlled with appropriate interventions Outcome: Completed/Met Date Met:  10/04/14 Goal: Afebrile, VS remain stable at discharge Outcome: Completed/Met Date Met:  10/04/14 Goal: Remove staples per MD order Outcome: Not Applicable Date Met:  02/24/63 Goal: MMR given as ordered Outcome: Not Applicable Date Met:  38/37/79 Goal: Discharge plan in place and appropriate Outcome: Completed/Met Date Met:  10/04/14 Goal: Other Discharge Outcomes/Goals Outcome: Not Applicable Date Met:  39/68/86

## 2014-10-04 NOTE — Discharge Summary (Signed)
POSTOPERATIVE DISCHARGE SUMMARY:  Patient ID: Tina Hart MRN: [id number] DOB/AGE: Sep 25, 1976 38 y.o.  Admit date: 10/02/2014 Admission Diagnoses: 39 weeks / previous cesarean section - desired repeat / undesired fertility  Discharge date:  10/04/2014 Discharge Diagnoses: POD 2 s/p repeat cesarean section and bilateral tubal sterilization                                         ABL anemia compounding IDA of pregnancy                                         Hypothyroidism                                         Depression  Prenatal history: G2P2001   EDC : 10/09/2014, by Other Basis  Prenatal care at [facility]  Primary provider : [place] Prenatal course complicated by AMA / previous CS / hypothyroidism / depression   Prenatal Labs: ABO, Rh: --/--/A POS, A POS (11/18 0947)  Antibody: NEG (11/18 0947) Rubella: Immune (04/23 0000)   RPR: NON REAC (11/18 0947)  HBsAg: Negative (04/23 0000)  HIV: Non-reactive (04/23 0000)  GTT : NL   Medical / Surgical History :  Past medical history:  Past Medical History  Diagnosis Date  . Hyperthyroidism   . Anxiety   . GERD (gastroesophageal reflux disease)     WITH PREGNANCY  . Headache   . Post partum depression     Past surgical history:  Past Surgical History  Procedure Laterality Date  . Cesarean section    . Wisdom tooth extraction    . Colonoscopy      Family History: History reviewed. No pertinent family history.  Social History:  reports that she has never smoked. She has never used smokeless tobacco. She reports that she drinks alcohol. She reports that she does not use illicit drugs.  Allergies: Erythromycin and Sulfonamide derivatives   Current Medications at time of admission:  Prior to Admission medications   Medication Sig Start Date End Date Taking? Authorizing Provider  docusate sodium (COLACE) 100 MG capsule Take 100 mg by mouth daily as needed for mild constipation.   Yes Historical  Provider, MD  levothyroxine (SYNTHROID, LEVOTHROID) 75 MCG tablet Take 75 mcg by mouth daily. Pt uses brand synthroid only.   Yes Historical Provider, MD  Prenatal Vit-Fe Fumarate-FA (PRENATAL MULTIVITAMIN) TABS tablet Take 1 tablet by mouth daily.   Yes Historical Provider, MD  ranitidine (ZANTAC) 150 MG tablet Take 150 mg by mouth 2 (two) times daily as needed for heartburn.   Yes Historical Provider, MD  sertraline (ZOLOFT) 50 MG tablet Take 50 mg by mouth daily.   Yes Historical Provider, MD  calcium carbonate (TUMS - DOSED IN MG ELEMENTAL CALCIUM) 500 MG chewable tablet Chew 2 tablets by mouth 2 (two) times daily as needed for indigestion or heartburn.    Historical Provider, MD    Procedures: Cesarean section delivery on 10/02/2014 with delivery of female newborn by Dr Ronita Hipps Bilateral tubal sterilization   See operative report for further details APGAR (1 MIN): 9   APGAR (5 MINS): 9    Postoperative / postpartum course:  Discharge on POD 2 - stable status Complicated by ABL anemia / hx depression  Discharge Instructions:  Discharged Condition: stable  Activity: pelvic rest and postoperative restrictions x 2   Diet: routine  Medications:    Medication List    STOP taking these medications        docusate sodium 100 MG capsule  Commonly known as:  COLACE     NIFEdipine 10 MG capsule  Commonly known as:  PROCARDIA     zolmitriptan 5 MG tablet  Commonly known as:  ZOMIG      TAKE these medications        calcium carbonate 500 MG chewable tablet  Commonly known as:  TUMS - dosed in mg elemental calcium  Chew 2 tablets by mouth 2 (two) times daily as needed for indigestion or heartburn.     ibuprofen 600 MG tablet  Commonly known as:  ADVIL,MOTRIN  Take 1 tablet (600 mg total) by mouth every 6 (six) hours as needed for mild pain.     iron polysaccharides 150 MG capsule  Commonly known as:  NIFEREX  Take 1 capsule (150 mg total) by mouth daily. BID x 2 weeks than  daily x 4 weeks     levothyroxine 75 MCG tablet  Commonly known as:  SYNTHROID, LEVOTHROID  Take 75 mcg by mouth daily. Pt uses brand synthroid only.     magnesium oxide 400 (241.3 MG) MG tablet  Commonly known as:  MAG-OX  200 MG PO 1 tablet daily with iron to prevent constipation BID x 2 weeks then QD x 4 weeks     oxyCODONE-acetaminophen 5-325 MG per tablet  Commonly known as:  PERCOCET/ROXICET  Take 1 tablet by mouth every 4 (four) hours as needed (for pain scale less than 7).     prenatal multivitamin Tabs tablet  Take 1 tablet by mouth daily.     ranitidine 150 MG tablet  Commonly known as:  ZANTAC  Take 150 mg by mouth 2 (two) times daily as needed for heartburn.     sertraline 25 MG tablet  Commonly known as:  ZOLOFT  Take 3 tablets (75 mg total) by mouth daily.  Start taking on:  10/05/2014     Vitamin D 2000 UNITS Caps  Take 1 capsule (2,000 Units total) by mouth daily.        Wound Care: keep clean and dry / remove honeycomb POD 5 Postpartum Instructions: [facility] discharge booklet - instructions reviewed  Discharge to: Home  Follow up :  [facility] in 2weeks PRN for interval visit with CNM or Dr Ronita Hipps for any worsening depression [facility] in 6 weeks for routine postpartum visit with Dr Ronita Hipps  Plan LAB at 6 weeks - TSH reflex / vitamin D / cbc / ferritin                Signed: Artelia Laroche CNM, MSN, [facility] 10/04/2014, 12:09 PM

## 2014-10-04 NOTE — Progress Notes (Signed)
POSTOPERATIVE DAY # 2 S/P repeat C/S and BTL   S:         Reports feeling emotional - crying all the time - did not sleep well, baby cluster fed all night             Wants to go home today, hoping Peds will discharge baby too             Some diarrhea over night             Nasal and chest congestion, non -productive cough             Tolerating po intake / no nausea / no vomiting / + flatus / no BM             Bleeding is light             Pain controlled withMotrin and Percocet             Up ad lib / ambulatory/ voiding QS  Newborn breastfeeding well - cluster feeding/ Circumcision - performed on 11/21   O:  VS: BP 126/68 mmHg  Pulse 80  Temp(Src) 98.7 F (37.1 C) (Oral)  Resp 18  SpO2 96%  LMP 01/02/2014  Breastfeeding? Unknown   LABS:               Recent Labs  10/03/14 0720  WBC 11.0*  HGB 8.4*  PLT 158               Bloodtype: --/--/A POS, A POS (11/18 0947)  Rubella: Immune (04/23 0000)                                             I&O: Intake/Output      11/21 0701 - 11/22 0700 11/22 0701 - 11/23 0700   P.O.     I.V.     Total Intake       Urine 1350    Blood     Total Output 1350     Net -1350                       Physical Exam:             Alert and Oriented X3, tearful throughout exam  Lungs: Clear and unlabored  Heart: regular rate and rhythm / no mumurs  Abdomen: soft, non-tender, non-distended, bowel sounds present in all 4 quadrants             Fundus: firm, non-tender, U-2              Dressing: honey comb dressing clean, dry, intact             Incision:  approximated with sutures / no erythema / no ecchymosis / no drainage  Perineum: intact  Lochia: small, red bleeding, no clots noted  Extremities: +1 edema, no calf pain or tenderness, negative Homans bilaterally  A:        POD # 2 S/P repeat C/S and BTL            Depression - on Zoloft            ABL Anemia - on Niferex   P:       Discharge home today            Increase Zoloft  dose to 75mg  daily - provided reassurance             Continue Niferex and Magnesium Oxide with Prenatal Vitamin            Instructed to hold stool softener for loose stools             Begin Vitamin D 4000 IUs daily             Postpartum Depression screening at 2 week visit - call if feeling worse            Increase Niferex to BID x 2 weeks, then daily for 4 weeks, then repeat CBC and Vitamin D at 6 weeks   Kassie Mends, SNM

## 2014-10-05 ENCOUNTER — Encounter (HOSPITAL_COMMUNITY): Payer: Self-pay | Admitting: Obstetrics and Gynecology

## 2015-03-14 HISTORY — PX: ENDOMETRIAL ABLATION W/ NOVASURE: SUR434

## 2015-10-11 ENCOUNTER — Ambulatory Visit (INDEPENDENT_AMBULATORY_CARE_PROVIDER_SITE_OTHER): Payer: BLUE CROSS/BLUE SHIELD | Admitting: Family Medicine

## 2015-10-11 ENCOUNTER — Encounter: Payer: Self-pay | Admitting: Family Medicine

## 2015-10-11 VITALS — BP 130/74 | HR 63 | Temp 98.7°F | Resp 18 | Wt 195.8 lb

## 2015-10-11 DIAGNOSIS — E039 Hypothyroidism, unspecified: Secondary | ICD-10-CM

## 2015-10-11 DIAGNOSIS — R635 Abnormal weight gain: Secondary | ICD-10-CM | POA: Diagnosis not present

## 2015-10-11 DIAGNOSIS — E559 Vitamin D deficiency, unspecified: Secondary | ICD-10-CM

## 2015-10-11 DIAGNOSIS — R790 Abnormal level of blood mineral: Secondary | ICD-10-CM

## 2015-10-11 LAB — CBC WITH DIFFERENTIAL/PLATELET
Basophils Absolute: 0 10*3/uL (ref 0.0–0.1)
Basophils Relative: 0 % (ref 0–1)
EOS ABS: 0.2 10*3/uL (ref 0.0–0.7)
EOS PCT: 2 % (ref 0–5)
HEMATOCRIT: 40.5 % (ref 36.0–46.0)
Hemoglobin: 13.2 g/dL (ref 12.0–15.0)
LYMPHS ABS: 2.1 10*3/uL (ref 0.7–4.0)
LYMPHS PCT: 22 % (ref 12–46)
MCH: 27.4 pg (ref 26.0–34.0)
MCHC: 32.6 g/dL (ref 30.0–36.0)
MCV: 84 fL (ref 78.0–100.0)
MONO ABS: 0.6 10*3/uL (ref 0.1–1.0)
MPV: 11 fL (ref 8.6–12.4)
Monocytes Relative: 6 % (ref 3–12)
Neutro Abs: 6.7 10*3/uL (ref 1.7–7.7)
Neutrophils Relative %: 70 % (ref 43–77)
Platelets: 278 10*3/uL (ref 150–400)
RBC: 4.82 MIL/uL (ref 3.87–5.11)
RDW: 14.4 % (ref 11.5–15.5)
WBC: 9.5 10*3/uL (ref 4.0–10.5)

## 2015-10-11 LAB — COMPLETE METABOLIC PANEL WITH GFR
ALBUMIN: 3.9 g/dL (ref 3.6–5.1)
ALK PHOS: 59 U/L (ref 33–115)
ALT: 8 U/L (ref 6–29)
AST: 10 U/L (ref 10–30)
BILIRUBIN TOTAL: 0.3 mg/dL (ref 0.2–1.2)
BUN: 9 mg/dL (ref 7–25)
CALCIUM: 9.1 mg/dL (ref 8.6–10.2)
CO2: 26 mmol/L (ref 20–31)
Chloride: 104 mmol/L (ref 98–110)
Creat: 0.71 mg/dL (ref 0.50–1.10)
Glucose, Bld: 81 mg/dL (ref 65–99)
Potassium: 4.7 mmol/L (ref 3.5–5.3)
Sodium: 139 mmol/L (ref 135–146)
TOTAL PROTEIN: 6.7 g/dL (ref 6.1–8.1)

## 2015-10-11 LAB — MAGNESIUM: Magnesium: 2 mg/dL (ref 1.5–2.5)

## 2015-10-11 LAB — HEMOGLOBIN A1C
HEMOGLOBIN A1C: 5.5 % (ref <5.7)
Mean Plasma Glucose: 111 mg/dL (ref <117)

## 2015-10-11 LAB — TSH: TSH: 1.826 u[IU]/mL (ref 0.350–4.500)

## 2015-10-11 LAB — T4, FREE: FREE T4: 1.01 ng/dL (ref 0.80–1.80)

## 2015-10-11 NOTE — Progress Notes (Signed)
Subjective:    Patient ID: Tina Hart, female    DOB: 08-02-1976, 39 y.o.   MRN: [id number]  HPI   Patient is here today to discuss her weight. She had her son about a year ago and since then has been training and has actually run a half marathon but hasn't reall been able to move her weight very much. She's is also concerned about potential for insulin resistance. She did have problems with fluid retention during her pregnancy.  She is hypothyroid and taking 65mcg daily.  She is not nursing.  No anemia during the pegnancy.  She was dx with vitamin D and mangnesium    hypothyroidism-no recent skin or hair changes. Her dos has ben hesame over the last year. She reports that she does take it regularly without any poblems or side effects. She has had some occ bouts of diarrhea.   Review of Systems  BP 130/74 mmHg  Pulse 63  Temp(Src) 98.7 F (37.1 C)  Resp 18  Wt 195 lb 12.8 oz (88.814 kg)  SpO2 98%  Breastfeeding? No    Allergies  Allergen Reactions  . Erythromycin Other (See Comments)    Pt states that this makes her stomach upset.   . Sulfonamide Derivatives Other (See Comments)    Pt states that she got welps all over her skin.     Past Medical History  Diagnosis Date  . Hyperthyroidism   . Anxiety   . GERD (gastroesophageal reflux disease)     WITH PREGNANCY  . Headache   . Post partum depression     Past Surgical History  Procedure Laterality Date  . Cesarean section    . Wisdom tooth extraction    . Colonoscopy    . Cesarean section with bilateral tubal ligation Bilateral 10/02/2014    Procedure: Repeat CESAREAN SECTION WITH BILATERAL TUBAL LIGATION;  Surgeon: Lovenia Kim, MD;  Location: [facility];  Service: Obstetrics;  Laterality: Bilateral;  EDD: 10/09/14    Social History   Social History  . Marital Status: Married    Spouse Name: N/A  . Number of Children: N/A  . Years of Education: N/A   Occupational History  . Not on file.   Social History  Main Topics  . Smoking status: Never Smoker   . Smokeless tobacco: Never Used  . Alcohol Use: Yes     Comment: social not during pregnancy  . Drug Use: No  . Sexual Activity: Not on file   Other Topics Concern  . Not on file   Social History Narrative    No family history on file.  Outpatient Encounter Prescriptions as of 10/11/2015  Medication Sig  . levothyroxine (SYNTHROID, LEVOTHROID) 75 MCG tablet Take 75 mcg by mouth daily. Pt uses brand synthroid only.  . sertraline (ZOLOFT) 25 MG tablet Take 3 tablets (75 mg total) by mouth daily.  Marland Kitchen ZOLMitriptan (ZOMIG) 2.5 MG tablet Take 2.5 mg by mouth once. May repeat in 2 hours if headache persists or recurs.  . [DISCONTINUED] calcium carbonate (TUMS - DOSED IN MG ELEMENTAL CALCIUM) 500 MG chewable tablet Chew 2 tablets by mouth 2 (two) times daily as needed for indigestion or heartburn.  . [DISCONTINUED] Cholecalciferol (VITAMIN D) 2000 UNITS CAPS Take 1 capsule (2,000 Units total) by mouth daily.  . [DISCONTINUED] ibuprofen (ADVIL,MOTRIN) 600 MG tablet Take 1 tablet (600 mg total) by mouth every 6 (six) hours as needed for mild pain.  . [DISCONTINUED] iron polysaccharides (NIFEREX) 150 MG  capsule Take 1 capsule (150 mg total) by mouth daily. BID x 2 weeks than daily x 4 weeks  . [DISCONTINUED] magnesium oxide (MAG-OX) 400 (241.3 MG) MG tablet 200 MG PO 1 tablet daily with iron to prevent constipation BID x 2 weeks then QD x 4 weeks  . [DISCONTINUED] oxyCODONE-acetaminophen (PERCOCET/ROXICET) 5-325 MG per tablet Take 1 tablet by mouth every 4 (four) hours as needed (for pain scale less than 7).  . [DISCONTINUED] Prenatal Vit-Fe Fumarate-FA (PRENATAL MULTIVITAMIN) TABS tablet Take 1 tablet by mouth daily.  . [DISCONTINUED] ranitidine (ZANTAC) 150 MG tablet Take 150 mg by mouth 2 (two) times daily as needed for heartburn.   No facility-administered encounter medications on file as of 10/11/2015.          Objective:   Physical Exam   Constitutional: She is oriented to person, place, and time. She appears well-developed and well-nourished.  HENT:  Head: Normocephalic and atraumatic.  Right Ear: External ear normal.  Left Ear: External ear normal.  Nose: Nose normal.  Mouth/Throat: Oropharynx is clear and moist.  TMs and canals are clear.   Eyes: Conjunctivae and EOM are normal. Pupils are equal, round, and reactive to light.  Neck: Neck supple. No thyromegaly present.  Cardiovascular: Normal rate, regular rhythm and normal heart sounds.   Pulmonary/Chest: Effort normal and breath sounds normal. She has no wheezes.  Abdominal: Soft. Bowel sounds are normal. She exhibits no distension. There is no tenderness. There is no rebound and no guarding.  Musculoskeletal: She exhibits no edema.  Lymphadenopathy:    She has no cervical adenopathy.  Neurological: She is alert and oriented to person, place, and time.  Skin: Skin is warm and dry.  Psychiatric: She has a normal mood and affect. Her behavior is normal.          Assessment & Plan:  Abnormal weight gain -    We discussed looking at some blood work. We'll check a TSH and she is hypothyroid and this has not been checked since last February right after she gave delivery. She was also diagnosed with low vitamin D as well as low magnesium. So will recheck those levels as well. Also check a CBc to evaluate for anemia which can surly be affecting exercise performance and ability to burn calories. If all is normal then recommend a smart phone ou suh as my fitnes pal to help her track calories to get a better sense of her daily intake.  Hypothyroidism - due to recheck TSH.   Vit D deficiency-she did complete supplementation and has completed it. Due to recheck levels.  Low magnesium-due to recheck levels. She's not actively taking supplement at this time.

## 2015-10-11 NOTE — Patient Instructions (Signed)
Smart phone app - My Fitness pal.  ( free to download ).

## 2015-10-12 ENCOUNTER — Institutional Professional Consult (permissible substitution): Payer: BLUE CROSS/BLUE SHIELD | Admitting: Family Medicine

## 2015-10-12 LAB — VITAMIN D 25 HYDROXY (VIT D DEFICIENCY, FRACTURES): Vit D, 25-Hydroxy: 24 ng/mL — ABNORMAL LOW (ref 30–100)

## 2015-10-13 ENCOUNTER — Ambulatory Visit (INDEPENDENT_AMBULATORY_CARE_PROVIDER_SITE_OTHER): Payer: BLUE CROSS/BLUE SHIELD | Admitting: Family Medicine

## 2015-10-13 ENCOUNTER — Encounter: Payer: Self-pay | Admitting: Family Medicine

## 2015-10-13 VITALS — BP 120/60 | HR 63 | Wt 196.0 lb

## 2015-10-13 DIAGNOSIS — M722 Plantar fascial fibromatosis: Secondary | ICD-10-CM | POA: Diagnosis not present

## 2015-10-13 NOTE — Assessment & Plan Note (Signed)
Treat with eccentric calf exercises, ice massage, and heel cups. Return in one month. At that time will perform gait analysis and consider orthotics.

## 2015-10-13 NOTE — Progress Notes (Signed)
   Subjective:    I'm seeing this patient as a consultation for:  Dr Madilyn Fireman  CC: Plantar foot pain  HPI: Patient has bilateral plantar foot pain. Symptoms started in February when she started training for a half marathon. She was able to successfully complete the [place] half marathon on November 6. She had pain in the plantar feet entire race continues to have pain during and following activity. The pain is sharp. No radiating pain weakness or numbness fevers or chills. She has tried using a roller ball which has helped a little. She denies any significant forefoot pain.  Past medical history, Surgical history, Family history not pertinant except as noted below, Social history, Allergies, and medications have been entered into the medical record, reviewed, and no changes needed.   Review of Systems: No headache, visual changes, nausea, vomiting, diarrhea, constipation, dizziness, abdominal pain, skin rash, fevers, chills, night sweats, weight loss, swollen lymph nodes, body aches, joint swelling, muscle aches, chest pain, shortness of breath, mood changes, visual or auditory hallucinations.   Objective:    Filed Vitals:   10/13/15 1020  BP: 120/60  Pulse: 63   General: Well Developed, well nourished, and in no acute distress.  Neuro/Psych: Alert and oriented x3, extra-ocular muscles intact, able to move all 4 extremities, sensation grossly intact. Skin: Warm and dry, no rashes noted.  Respiratory: Not using accessory muscles, speaking in full sentences, trachea midline.  Cardiovascular: Pulses palpable, no extremity edema. Abdomen: Does not appear distended. MSK: Feet are short and wide bilaterally with bunions and bunionette's bilaterally. No significant abnormal callous formation. The forefoot is nontender. Pulses capillary refill sensation intact. Normal foot motion. The plantar medial calcaneus is tender bilaterally. No significant foot deformity with standing. Normal heel  valgus with toe stand. No leg length discrepancy.  No results found for this or any previous visit (from the past 24 hour(s)). No results found.  Impression and Recommendations:   This case required medical decision making of moderate complexity.

## 2015-10-13 NOTE — Patient Instructions (Signed)
Thank you for coming in today.  Do the eccentric calf exercises. Remember to go down slowly.  Do 2-3 sets of 30 reps daily.  Do the toe pull back ice massage.  Try gel heel cups.  Return in 1 month with your running shoes and shorts.   Plantar Fasciitis With Rehab The plantar fascia is a fibrous, ligament-like, soft-tissue structure that spans the bottom of the foot. Plantar fasciitis, also called heel spur syndrome, is a condition that causes pain in the foot due to inflammation of the tissue. SYMPTOMS   Pain and tenderness on the underneath side of the foot.  Pain that worsens with standing or walking. CAUSES  Plantar fasciitis is caused by irritation and injury to the plantar fascia on the underneath side of the foot. Common mechanisms of injury include:  Direct trauma to bottom of the foot.  Damage to a small nerve that runs under the foot where the main fascia attaches to the heel bone.  Stress placed on the plantar fascia due to bone spurs. RISK INCREASES WITH:   Activities that place stress on the plantar fascia (running, jumping, pivoting, or cutting).  Poor strength and flexibility.  Improperly fitted shoes.  Tight calf muscles.  Flat feet.  Failure to warm-up properly before activity.  Obesity. PREVENTION  Warm up and stretch properly before activity.  Allow for adequate recovery between workouts.  Maintain physical fitness:  Strength, flexibility, and endurance.  Cardiovascular fitness.  Maintain a health body weight.  Avoid stress on the plantar fascia.  Wear properly fitted shoes, including arch supports for individuals who have flat feet. PROGNOSIS  If treated properly, then the symptoms of plantar fasciitis usually resolve without surgery. However, occasionally surgery is necessary. RELATED COMPLICATIONS   Recurrent symptoms that may result in a chronic condition.  Problems of the lower back that are caused by compensating for the injury,  such as limping.  Pain or weakness of the foot during push-off following surgery.  Chronic inflammation, scarring, and partial or complete fascia tear, occurring more often from repeated injections. TREATMENT  Treatment initially involves the use of ice and medication to help reduce pain and inflammation. The use of strengthening and stretching exercises may help reduce pain with activity, especially stretches of the Achilles tendon. These exercises may be performed at home or with a therapist. Your caregiver may recommend that you use heel cups of arch supports to help reduce stress on the plantar fascia. Occasionally, corticosteroid injections are given to reduce inflammation. If symptoms persist for greater than 6 months despite non-surgical (conservative), then surgery may be recommended.  MEDICATION   If pain medication is necessary, then nonsteroidal anti-inflammatory medications, such as aspirin and ibuprofen, or other minor pain relievers, such as acetaminophen, are often recommended.  Do not take pain medication within 7 days before surgery.  Prescription pain relievers may be given if deemed necessary by your caregiver. Use only as directed and only as much as you need.  Corticosteroid injections may be given by your caregiver. These injections should be reserved for the most serious cases, because they may only be given a certain number of times. HEAT AND COLD  Cold treatment (icing) relieves pain and reduces inflammation. Cold treatment should be applied for 10 to 15 minutes every 2 to 3 hours for inflammation and pain and immediately after any activity that aggravates your symptoms. Use ice packs or massage the area with a piece of ice (ice massage).  Heat treatment may be used  prior to performing the stretching and strengthening activities prescribed by your caregiver, physical therapist, or athletic trainer. Use a heat pack or soak the injury in warm water. SEEK IMMEDIATE MEDICAL  CARE IF:  Treatment seems to offer no benefit, or the condition worsens.  Any medications produce adverse side effects. EXERCISES RANGE OF MOTION (ROM) AND STRETCHING EXERCISES - Plantar Fasciitis (Heel Spur Syndrome) These exercises may help you when beginning to rehabilitate your injury. Your symptoms may resolve with or without further involvement from your physician, physical therapist or athletic trainer. While completing these exercises, remember:   Restoring tissue flexibility helps normal motion to return to the joints. This allows healthier, less painful movement and activity.  An effective stretch should be held for at least 30 seconds.  A stretch should never be painful. You should only feel a gentle lengthening or release in the stretched tissue. RANGE OF MOTION - Toe Extension, Flexion  Sit with your right / left leg crossed over your opposite knee.  Grasp your toes and gently pull them back toward the top of your foot. You should feel a stretch on the bottom of your toes and/or foot.  Hold this stretch for __________ seconds.  Now, gently pull your toes toward the bottom of your foot. You should feel a stretch on the top of your toes and or foot.  Hold this stretch for __________ seconds. Repeat __________ times. Complete this stretch __________ times per day.  RANGE OF MOTION - Ankle Dorsiflexion, Active Assisted  Remove shoes and sit on a chair that is preferably not on a carpeted surface.  Place right / left foot under knee. Extend your opposite leg for support.  Keeping your heel down, slide your right / left foot back toward the chair until you feel a stretch at your ankle or calf. If you do not feel a stretch, slide your bottom forward to the edge of the chair, while still keeping your heel down.  Hold this stretch for __________ seconds. Repeat __________ times. Complete this stretch __________ times per day.  STRETCH - Gastroc, Standing  Place hands on  wall.  Extend right / left leg, keeping the front knee somewhat bent.  Slightly point your toes inward on your back foot.  Keeping your right / left heel on the floor and your knee straight, shift your weight toward the wall, not allowing your back to arch.  You should feel a gentle stretch in the right / left calf. Hold this position for __________ seconds. Repeat __________ times. Complete this stretch __________ times per day. STRETCH - Soleus, Standing  Place hands on wall.  Extend right / left leg, keeping the other knee somewhat bent.  Slightly point your toes inward on your back foot.  Keep your right / left heel on the floor, bend your back knee, and slightly shift your weight over the back leg so that you feel a gentle stretch deep in your back calf.  Hold this position for __________ seconds. Repeat __________ times. Complete this stretch __________ times per day. STRETCH - Gastrocsoleus, Standing  Note: This exercise can place a lot of stress on your foot and ankle. Please complete this exercise only if specifically instructed by your caregiver.   Place the ball of your right / left foot on a step, keeping your other foot firmly on the same step.  Hold on to the wall or a rail for balance.  Slowly lift your other foot, allowing your body weight to  press your heel down over the edge of the step.  You should feel a stretch in your right / left calf.  Hold this position for __________ seconds.  Repeat this exercise with a slight bend in your right / left knee. Repeat __________ times. Complete this stretch __________ times per day.  STRENGTHENING EXERCISES - Plantar Fasciitis (Heel Spur Syndrome)  These exercises may help you when beginning to rehabilitate your injury. They may resolve your symptoms with or without further involvement from your physician, physical therapist or athletic trainer. While completing these exercises, remember:   Muscles can gain both the  endurance and the strength needed for everyday activities through controlled exercises.  Complete these exercises as instructed by your physician, physical therapist or athletic trainer. Progress the resistance and repetitions only as guided. STRENGTH - Towel Curls  Sit in a chair positioned on a non-carpeted surface.  Place your foot on a towel, keeping your heel on the floor.  Pull the towel toward your heel by only curling your toes. Keep your heel on the floor.  If instructed by your physician, physical therapist or athletic trainer, add ____________________ at the end of the towel. Repeat __________ times. Complete this exercise __________ times per day. STRENGTH - Ankle Inversion  Secure one end of a rubber exercise band/tubing to a fixed object (table, pole). Loop the other end around your foot just before your toes.  Place your fists between your knees. This will focus your strengthening at your ankle.  Slowly, pull your big toe up and in, making sure the band/tubing is positioned to resist the entire motion.  Hold this position for __________ seconds.  Have your muscles resist the band/tubing as it slowly pulls your foot back to the starting position. Repeat __________ times. Complete this exercises __________ times per day.    This information is not intended to replace advice given to you by your health care provider. Make sure you discuss any questions you have with your health care provider.   Document Released: 10/30/2005 Document Revised: 03/16/2015 Document Reviewed: 02/11/2009 Elsevier Interactive Patient Education [employer].

## 2015-11-09 ENCOUNTER — Ambulatory Visit: Payer: BLUE CROSS/BLUE SHIELD | Admitting: Family Medicine

## 2015-11-17 ENCOUNTER — Ambulatory Visit: Payer: BLUE CROSS/BLUE SHIELD | Admitting: Family Medicine

## 2015-11-30 ENCOUNTER — Ambulatory Visit: Payer: BLUE CROSS/BLUE SHIELD | Admitting: Family Medicine

## 2015-12-07 ENCOUNTER — Ambulatory Visit: Payer: BLUE CROSS/BLUE SHIELD | Admitting: Family Medicine

## 2016-03-22 DIAGNOSIS — N946 Dysmenorrhea, unspecified: Secondary | ICD-10-CM | POA: Diagnosis not present

## 2016-03-28 DIAGNOSIS — L820 Inflamed seborrheic keratosis: Secondary | ICD-10-CM | POA: Diagnosis not present

## 2016-03-28 DIAGNOSIS — L570 Actinic keratosis: Secondary | ICD-10-CM | POA: Diagnosis not present

## 2016-04-21 DIAGNOSIS — E6609 Other obesity due to excess calories: Secondary | ICD-10-CM | POA: Diagnosis not present

## 2016-04-21 DIAGNOSIS — R948 Abnormal results of function studies of other organs and systems: Secondary | ICD-10-CM | POA: Diagnosis not present

## 2016-04-21 DIAGNOSIS — R635 Abnormal weight gain: Secondary | ICD-10-CM | POA: Diagnosis not present

## 2016-04-21 DIAGNOSIS — F4323 Adjustment disorder with mixed anxiety and depressed mood: Secondary | ICD-10-CM | POA: Diagnosis not present

## 2016-04-21 DIAGNOSIS — F54 Psychological and behavioral factors associated with disorders or diseases classified elsewhere: Secondary | ICD-10-CM | POA: Diagnosis not present

## 2016-04-25 DIAGNOSIS — Z6836 Body mass index (BMI) 36.0-36.9, adult: Secondary | ICD-10-CM | POA: Diagnosis not present

## 2016-04-25 DIAGNOSIS — R635 Abnormal weight gain: Secondary | ICD-10-CM | POA: Diagnosis not present

## 2016-04-25 DIAGNOSIS — E669 Obesity, unspecified: Secondary | ICD-10-CM | POA: Diagnosis not present

## 2016-05-11 DIAGNOSIS — G5601 Carpal tunnel syndrome, right upper limb: Secondary | ICD-10-CM | POA: Diagnosis not present

## 2016-05-12 DIAGNOSIS — G5601 Carpal tunnel syndrome, right upper limb: Secondary | ICD-10-CM | POA: Diagnosis not present

## 2016-06-13 HISTORY — PX: CARPAL TUNNEL RELEASE: SHX101

## 2016-06-16 DIAGNOSIS — N946 Dysmenorrhea, unspecified: Secondary | ICD-10-CM | POA: Diagnosis not present

## 2016-07-05 DIAGNOSIS — G5601 Carpal tunnel syndrome, right upper limb: Secondary | ICD-10-CM | POA: Diagnosis not present

## 2016-09-04 ENCOUNTER — Other Ambulatory Visit: Payer: Self-pay | Admitting: Obstetrics and Gynecology

## 2016-09-11 NOTE — Patient Instructions (Signed)
Your procedure is scheduled on:  Monday, Nov. 6, 2017  Enter through the [employer] of [facility] at:  11:30 AM   Pick up the phone at the desk and dial [phone].  Call this number if you have problems the morning of surgery: [phone].  Remember: Do NOT eat food:  After Midnight Sunday, Nov. 5, 2017  Do NOT drink clear liquids after:  9:00 AM day of surgery  Take these medicines the morning of surgery with a SIP OF WATER:  Levothyroxine, Zoloft  Stop ALL herbal medications at this time   Do NOT wear jewelry (body piercing), metal hair clips/bobby pins, make-up, or nail polish. Do NOT wear lotions, powders, or perfumes.  You may wear deodorant. Do NOT shave for 48 hours prior to surgery. Do NOT bring valuables to the hospital. Contacts, dentures, or bridgework may not be worn into surgery.  Leave suitcase in car.  After surgery it may be brought to your room.  For patients admitted to the hospital, checkout time is 11:00 AM the day of discharge.

## 2016-09-12 ENCOUNTER — Encounter (HOSPITAL_COMMUNITY)
Admission: RE | Admit: 2016-09-12 | Discharge: 2016-09-12 | Disposition: A | Discharge status: Home / Self Care | Payer: BLUE CROSS/BLUE SHIELD | Source: Ambulatory Visit | Attending: Obstetrics and Gynecology | Admitting: Obstetrics and Gynecology

## 2016-09-12 ENCOUNTER — Encounter (HOSPITAL_COMMUNITY): Payer: Self-pay

## 2016-09-12 DIAGNOSIS — Z01812 Encounter for preprocedural laboratory examination: Secondary | ICD-10-CM | POA: Insufficient documentation

## 2016-09-12 HISTORY — DX: Other specified postprocedural states: Z98.890

## 2016-09-12 HISTORY — DX: Other specified postprocedural states: R11.2

## 2016-09-12 LAB — BASIC METABOLIC PANEL
ANION GAP: 6 (ref 5–15)
BUN: 7 mg/dL (ref 6–20)
CO2: 26 mmol/L (ref 22–32)
Calcium: 8.8 mg/dL — ABNORMAL LOW (ref 8.9–10.3)
Chloride: 104 mmol/L (ref 101–111)
Creatinine, Ser: 0.74 mg/dL (ref 0.44–1.00)
GFR calc Af Amer: >60 mL/min (ref >60)
GLUCOSE: 117 mg/dL — AB (ref 65–99)
POTASSIUM: 4.1 mmol/L (ref 3.5–5.1)
Sodium: 136 mmol/L (ref 135–145)

## 2016-09-12 LAB — CBC
HEMATOCRIT: 40.5 % (ref 36.0–46.0)
HEMOGLOBIN: 13.3 g/dL (ref 12.0–15.0)
MCH: 27.8 pg (ref 26.0–34.0)
MCHC: 32.8 g/dL (ref 30.0–36.0)
MCV: 84.6 fL (ref 78.0–100.0)
Platelets: 319 10*3/uL (ref 150–400)
RBC: 4.79 MIL/uL (ref 3.87–5.11)
RDW: 14 % (ref 11.5–15.5)
WBC: 9.4 10*3/uL (ref 4.0–10.5)

## 2016-09-17 NOTE — H&P (Signed)
[name], Tina Hart              ACCOUNT NO.:  [id number]  MEDICAL RECORD NO.:  [id number]  LOCATION:  PERIO                         FACILITY:  [facility]  PHYSICIAN:  Lovenia Kim, M.D.DATE OF BIRTH:  1976/10/25  DATE OF ADMISSION:  07/03/2016 DATE OF DISCHARGE:                             HISTORY & PHYSICAL   Outpatient surgery on September 18, 2016.  CHIEF COMPLAINT:  Persistent dysmenorrhea and irregular bleeding status post failed endometrial ablation.  HISTORY OF PRESENT ILLNESS:  A 40 year old white female, G4, P2, history of C-section x2, who presents now status post failed ablation in May 2016 for definitive therapy.  She has required birth control pills and pain medications for dysmenorrhea since a failed ablation in 2016.  She has a history of C-section x2 with tubal ligation.  ALLERGIES:  She has allergies to SULFA DRUGS and ERYTHROMYCIN.  FAMILY HISTORY:  Heart disease, pancreatic and breast cancer, chronic hypertension, and melanoma.  SOCIAL HISTORY:  She is a nonsmoker, nondrinker.  She denies domestic physical violence.  PAST SURGICAL HISTORY:  She has a history of 2 previous miscarriages and 2 previous uncomplicated C-sections.  In addition, her surgical history is remarkable for NovaSure and tubal ligation at the time of her last C- section.  MEDICATIONS:  Include Lo Loestrin, Synthroid, Zomig as needed, tramadol as needed, Zoloft daily, and vitamin D supplement.  PHYSICAL EXAMINATION:  GENERAL:  She is a well-developed, well-nourished white female, in no acute distress. HEENT:  Normal. NECK:  Supple.  Full range of motion. LUNGS:  Clear. ABDOMEN:  Soft, nontender.  Well-healed transverse lower abdominal scar noted. PELVIC:  The uterus is normal size, shape, retroflexed.  No adnexal masses are appreciated. EXTREMITIES:  There are no cords. NEUROLOGIC:  Nonfocal. SKIN:  Intact.  IMPRESSION:  Refractory dysmenorrhea, pelvic pain, and  menorrhagia status post failed endometrial ablation, questionable adenomyosis, questionable hematosalpinx.  PLAN:  Plan is to proceed with da Vinci-assisted total laparoscopic hysterectomy, bilateral salpingectomy.  Risks of anesthesia, infection, bleeding, injury to surrounding organs, possible need for repair was discussed.  Delayed versus immediate complications to include bowel and bladder injury noted.  The patient acknowledges and wishes to proceed.     Lovenia Kim, M.D.     RJT/MEDQ  D:  09/17/2016  T:  09/17/2016  Job:  [id number]  cc:   OB/GYN

## 2016-09-18 ENCOUNTER — Ambulatory Visit (HOSPITAL_COMMUNITY): Payer: BLUE CROSS/BLUE SHIELD | Admitting: Anesthesiology

## 2016-09-18 ENCOUNTER — Encounter (HOSPITAL_COMMUNITY): Payer: Self-pay | Admitting: Anesthesiology

## 2016-09-18 ENCOUNTER — Ambulatory Visit (HOSPITAL_COMMUNITY)
Admission: RE | Admit: 2016-09-18 | Discharge: 2016-09-19 | Disposition: A | Discharge status: Home / Self Care | Payer: BLUE CROSS/BLUE SHIELD | Source: Ambulatory Visit | Attending: Obstetrics and Gynecology | Admitting: Obstetrics and Gynecology

## 2016-09-18 ENCOUNTER — Encounter (HOSPITAL_COMMUNITY)
Admission: RE | Disposition: A | Discharge status: Home / Self Care | Payer: Self-pay | Source: Ambulatory Visit | Attending: Obstetrics and Gynecology

## 2016-09-18 DIAGNOSIS — N920 Excessive and frequent menstruation with regular cycle: Secondary | ICD-10-CM | POA: Diagnosis not present

## 2016-09-18 DIAGNOSIS — E669 Obesity, unspecified: Secondary | ICD-10-CM | POA: Insufficient documentation

## 2016-09-18 DIAGNOSIS — F329 Major depressive disorder, single episode, unspecified: Secondary | ICD-10-CM | POA: Diagnosis not present

## 2016-09-18 DIAGNOSIS — E039 Hypothyroidism, unspecified: Secondary | ICD-10-CM | POA: Diagnosis not present

## 2016-09-18 DIAGNOSIS — Z882 Allergy status to sulfonamides status: Secondary | ICD-10-CM | POA: Insufficient documentation

## 2016-09-18 DIAGNOSIS — K219 Gastro-esophageal reflux disease without esophagitis: Secondary | ICD-10-CM | POA: Insufficient documentation

## 2016-09-18 DIAGNOSIS — N800 Endometriosis of uterus: Secondary | ICD-10-CM | POA: Diagnosis not present

## 2016-09-18 DIAGNOSIS — N736 Female pelvic peritoneal adhesions (postinfective): Secondary | ICD-10-CM | POA: Insufficient documentation

## 2016-09-18 DIAGNOSIS — N803 Endometriosis of pelvic peritoneum: Secondary | ICD-10-CM | POA: Diagnosis not present

## 2016-09-18 DIAGNOSIS — N8302 Follicular cyst of left ovary: Secondary | ICD-10-CM | POA: Diagnosis not present

## 2016-09-18 DIAGNOSIS — N809 Endometriosis, unspecified: Secondary | ICD-10-CM | POA: Diagnosis present

## 2016-09-18 DIAGNOSIS — E0590 Thyrotoxicosis, unspecified without thyrotoxic crisis or storm: Secondary | ICD-10-CM | POA: Diagnosis not present

## 2016-09-18 DIAGNOSIS — N72 Inflammatory disease of cervix uteri: Secondary | ICD-10-CM | POA: Insufficient documentation

## 2016-09-18 DIAGNOSIS — Z6835 Body mass index (BMI) 35.0-35.9, adult: Secondary | ICD-10-CM | POA: Diagnosis not present

## 2016-09-18 DIAGNOSIS — Z881 Allergy status to other antibiotic agents status: Secondary | ICD-10-CM | POA: Insufficient documentation

## 2016-09-18 DIAGNOSIS — R102 Pelvic and perineal pain: Secondary | ICD-10-CM | POA: Diagnosis not present

## 2016-09-18 DIAGNOSIS — F419 Anxiety disorder, unspecified: Secondary | ICD-10-CM | POA: Diagnosis not present

## 2016-09-18 DIAGNOSIS — N946 Dysmenorrhea, unspecified: Secondary | ICD-10-CM | POA: Insufficient documentation

## 2016-09-18 HISTORY — PX: ROBOTIC ASSISTED TOTAL HYSTERECTOMY WITH SALPINGECTOMY: SHX6679

## 2016-09-18 HISTORY — PX: OOPHORECTOMY: SHX6387

## 2016-09-18 SURGERY — ROBOTIC ASSISTED TOTAL HYSTERECTOMY WITH SALPINGECTOMY
Anesthesia: General | Site: Abdomen | Laterality: Left

## 2016-09-18 MED ORDER — BUPIVACAINE HCL (PF) 0.25 % IJ SOLN
INTRAMUSCULAR | Status: AC
  Filled 2016-09-18: qty 30

## 2016-09-18 MED ORDER — DIPHENHYDRAMINE HCL 50 MG/ML IJ SOLN
12.5000 mg | Freq: Once | INTRAMUSCULAR | Status: AC
  Administered 2016-09-18: 12.5 mg via INTRAVENOUS

## 2016-09-18 MED ORDER — FENTANYL CITRATE (PF) 100 MCG/2ML IJ SOLN
INTRAMUSCULAR | Status: AC
  Filled 2016-09-18: qty 2

## 2016-09-18 MED ORDER — ONDANSETRON HCL 4 MG/2ML IJ SOLN
4.0000 mg | Freq: Four times a day (QID) | INTRAMUSCULAR | Status: DC | PRN
  Administered 2016-09-18: 4 mg via INTRAVENOUS
  Filled 2016-09-18: qty 2

## 2016-09-18 MED ORDER — SUGAMMADEX SODIUM 200 MG/2ML IV SOLN
INTRAVENOUS | Status: DC | PRN
  Administered 2016-09-18: 200 mg via INTRAVENOUS

## 2016-09-18 MED ORDER — DIPHENHYDRAMINE HCL 50 MG/ML IJ SOLN
INTRAMUSCULAR | Status: AC
  Administered 2016-09-18: 12.5 mg via INTRAVENOUS
  Filled 2016-09-18: qty 1

## 2016-09-18 MED ORDER — BUPIVACAINE HCL (PF) 0.25 % IJ SOLN
INTRAMUSCULAR | Status: DC | PRN
  Administered 2016-09-18: 6 mL

## 2016-09-18 MED ORDER — ONDANSETRON HCL 4 MG/2ML IJ SOLN
INTRAMUSCULAR | Status: DC | PRN
  Administered 2016-09-18: 4 mg via INTRAVENOUS

## 2016-09-18 MED ORDER — SODIUM CHLORIDE 0.9 % IJ SOLN
INTRAMUSCULAR | Status: AC
  Filled 2016-09-18: qty 50

## 2016-09-18 MED ORDER — SODIUM CHLORIDE 0.9 % IV SOLN
INTRAVENOUS | Status: DC | PRN
  Administered 2016-09-18: 60 mL

## 2016-09-18 MED ORDER — MEPERIDINE HCL 25 MG/ML IJ SOLN: 6.2500 mg | INTRAMUSCULAR | Status: DC | PRN

## 2016-09-18 MED ORDER — ONDANSETRON HCL 4 MG/2ML IJ SOLN
INTRAMUSCULAR | Status: AC
  Filled 2016-09-18: qty 2

## 2016-09-18 MED ORDER — OXYCODONE-ACETAMINOPHEN 5-325 MG PO TABS
1.0000 | ORAL_TABLET | ORAL | Status: DC | PRN
  Administered 2016-09-19: 2 via ORAL
  Filled 2016-09-18: qty 2

## 2016-09-18 MED ORDER — BSS IO SOLN
15.0000 mL | Freq: Once | INTRAOCULAR | Status: AC
  Administered 2016-09-18: 15 mL
  Filled 2016-09-18: qty 15

## 2016-09-18 MED ORDER — LIDOCAINE HCL (CARDIAC) 20 MG/ML IV SOLN
INTRAVENOUS | Status: DC | PRN
  Administered 2016-09-18: 100 mg via INTRAVENOUS

## 2016-09-18 MED ORDER — HYDROMORPHONE HCL 1 MG/ML IJ SOLN
INTRAMUSCULAR | Status: AC
  Filled 2016-09-18: qty 1

## 2016-09-18 MED ORDER — HYDROMORPHONE HCL 1 MG/ML IJ SOLN
INTRAMUSCULAR | Status: DC | PRN
  Administered 2016-09-18: 1 mg via INTRAVENOUS

## 2016-09-18 MED ORDER — MENTHOL 3 MG MT LOZG
1.0000 | LOZENGE | OROMUCOSAL | Status: DC | PRN
  Administered 2016-09-18: 3 mg via ORAL
  Filled 2016-09-18: qty 9

## 2016-09-18 MED ORDER — LEVOTHYROXINE SODIUM 75 MCG PO TABS
75.0000 ug | ORAL_TABLET | Freq: Every day | ORAL | Status: DC
  Administered 2016-09-19: 75 ug via ORAL
  Filled 2016-09-18: qty 1

## 2016-09-18 MED ORDER — KETOROLAC TROMETHAMINE 30 MG/ML IJ SOLN
INTRAMUSCULAR | Status: AC
  Filled 2016-09-18: qty 1

## 2016-09-18 MED ORDER — METOCLOPRAMIDE HCL 5 MG/ML IJ SOLN: 10.0000 mg | Freq: Once | INTRAMUSCULAR | Status: DC | PRN

## 2016-09-18 MED ORDER — DEXTROSE IN LACTATED RINGERS 5 % IV SOLN
INTRAVENOUS | Status: DC
  Administered 2016-09-18 – 2016-09-19 (×2): via INTRAVENOUS

## 2016-09-18 MED ORDER — ROCURONIUM BROMIDE 100 MG/10ML IV SOLN
INTRAVENOUS | Status: AC
  Filled 2016-09-18: qty 1

## 2016-09-18 MED ORDER — FENTANYL CITRATE (PF) 100 MCG/2ML IJ SOLN
INTRAMUSCULAR | Status: DC | PRN
  Administered 2016-09-18 (×2): 50 ug via INTRAVENOUS
  Administered 2016-09-18: 100 ug via INTRAVENOUS
  Administered 2016-09-18: 150 ug via INTRAVENOUS

## 2016-09-18 MED ORDER — LACTATED RINGERS IV SOLN
INTRAVENOUS | Status: DC
  Administered 2016-09-18 (×2): via INTRAVENOUS
  Administered 2016-09-18: 125 mL/h via INTRAVENOUS

## 2016-09-18 MED ORDER — TRAMADOL HCL 50 MG PO TABS: 50.0000 mg | ORAL_TABLET | Freq: Four times a day (QID) | ORAL | Status: DC | PRN

## 2016-09-18 MED ORDER — LIDOCAINE HCL (CARDIAC) 20 MG/ML IV SOLN
INTRAVENOUS | Status: AC
  Filled 2016-09-18: qty 5

## 2016-09-18 MED ORDER — HYDROMORPHONE HCL 1 MG/ML IJ SOLN
INTRAMUSCULAR | Status: AC
  Administered 2016-09-18: 0.25 mg via INTRAVENOUS
  Filled 2016-09-18: qty 1

## 2016-09-18 MED ORDER — KETOROLAC TROMETHAMINE 30 MG/ML IJ SOLN
INTRAMUSCULAR | Status: DC | PRN
  Administered 2016-09-18: 30 mg via INTRAVENOUS

## 2016-09-18 MED ORDER — DIPHENHYDRAMINE HCL 12.5 MG/5ML PO ELIX: 12.5000 mg | ORAL_SOLUTION | Freq: Four times a day (QID) | ORAL | Status: DC | PRN

## 2016-09-18 MED ORDER — ROPIVACAINE HCL 5 MG/ML IJ SOLN
INTRAMUSCULAR | Status: AC
  Filled 2016-09-18: qty 30

## 2016-09-18 MED ORDER — MIDAZOLAM HCL 2 MG/2ML IJ SOLN
INTRAMUSCULAR | Status: AC
Start: 2016-09-18 — End: 2016-09-18
  Filled 2016-09-18: qty 2

## 2016-09-18 MED ORDER — NALOXONE HCL 0.4 MG/ML IJ SOLN: 0.4000 mg | INTRAMUSCULAR | Status: DC | PRN

## 2016-09-18 MED ORDER — DIPHENHYDRAMINE HCL 50 MG/ML IJ SOLN: 12.5000 mg | Freq: Four times a day (QID) | INTRAMUSCULAR | Status: DC | PRN

## 2016-09-18 MED ORDER — HYDROMORPHONE HCL 1 MG/ML IJ SOLN
0.2500 mg | INTRAMUSCULAR | Status: DC | PRN
  Administered 2016-09-18: 0.25 mg via INTRAVENOUS

## 2016-09-18 MED ORDER — SODIUM CHLORIDE 0.9% FLUSH: 9.0000 mL | INTRAVENOUS | Status: DC | PRN

## 2016-09-18 MED ORDER — KETOROLAC TROMETHAMINE 0.5 % OP SOLN
1.0000 [drp] | Freq: Three times a day (TID) | OPHTHALMIC | Status: DC | PRN
Start: 2016-09-18 — End: 2016-09-19
  Filled 2016-09-18: qty 3

## 2016-09-18 MED ORDER — SUGAMMADEX SODIUM 200 MG/2ML IV SOLN
INTRAVENOUS | Status: AC
  Filled 2016-09-18: qty 2

## 2016-09-18 MED ORDER — PROPOFOL 10 MG/ML IV BOLUS
INTRAVENOUS | Status: AC
  Filled 2016-09-18: qty 20

## 2016-09-18 MED ORDER — SCOPOLAMINE 1 MG/3DAYS TD PT72
MEDICATED_PATCH | TRANSDERMAL | Status: AC
  Administered 2016-09-18: 1.5 mg via TRANSDERMAL
  Filled 2016-09-18: qty 1

## 2016-09-18 MED ORDER — POLYMYXIN B-TRIMETHOPRIM 10000-0.1 UNIT/ML-% OP SOLN
1.0000 [drp] | Freq: Three times a day (TID) | OPHTHALMIC | Status: DC
  Administered 2016-09-18 – 2016-09-19 (×2): 1 [drp] via OPHTHALMIC
  Filled 2016-09-18: qty 10

## 2016-09-18 MED ORDER — ARTIFICIAL TEARS OP OINT
TOPICAL_OINTMENT | OPHTHALMIC | Status: AC
  Filled 2016-09-18: qty 3.5

## 2016-09-18 MED ORDER — SCOPOLAMINE 1 MG/3DAYS TD PT72
1.0000 | MEDICATED_PATCH | Freq: Once | TRANSDERMAL | Status: DC
  Administered 2016-09-18: 1.5 mg via TRANSDERMAL

## 2016-09-18 MED ORDER — ROCURONIUM BROMIDE 100 MG/10ML IV SOLN
INTRAVENOUS | Status: DC | PRN
  Administered 2016-09-18: 20 mg via INTRAVENOUS
  Administered 2016-09-18: 50 mg via INTRAVENOUS

## 2016-09-18 MED ORDER — CEFAZOLIN SODIUM-DEXTROSE 2-4 GM/100ML-% IV SOLN
2.0000 g | INTRAVENOUS | Status: AC
  Administered 2016-09-18: 2 g via INTRAVENOUS

## 2016-09-18 MED ORDER — FENTANYL CITRATE (PF) 250 MCG/5ML IJ SOLN
INTRAMUSCULAR | Status: AC
  Filled 2016-09-18: qty 5

## 2016-09-18 MED ORDER — LACTATED RINGERS IR SOLN
Status: DC | PRN
  Administered 2016-09-18: 3000 mL

## 2016-09-18 MED ORDER — HYDROMORPHONE 1 MG/ML IV SOLN
INTRAVENOUS | Status: DC
  Administered 2016-09-18: 18:00:00 via INTRAVENOUS
  Administered 2016-09-18: 0.8 mg via INTRAVENOUS
  Administered 2016-09-19: 1.2 mL via INTRAVENOUS
  Administered 2016-09-19 (×2): 0.2 mg via INTRAVENOUS
  Filled 2016-09-18: qty 25

## 2016-09-18 MED ORDER — PROPOFOL 10 MG/ML IV BOLUS
INTRAVENOUS | Status: DC | PRN
  Administered 2016-09-18: 200 mg via INTRAVENOUS

## 2016-09-18 MED ORDER — DEXAMETHASONE SODIUM PHOSPHATE 10 MG/ML IJ SOLN
INTRAMUSCULAR | Status: DC | PRN
  Administered 2016-09-18: 10 mg via INTRAVENOUS

## 2016-09-18 MED ORDER — DEXAMETHASONE SODIUM PHOSPHATE 10 MG/ML IJ SOLN
INTRAMUSCULAR | Status: AC
  Filled 2016-09-18: qty 1

## 2016-09-18 MED ORDER — MIDAZOLAM HCL 5 MG/5ML IJ SOLN
INTRAMUSCULAR | Status: DC | PRN
  Administered 2016-09-18: 2 mg via INTRAVENOUS

## 2016-09-18 SURGICAL SUPPLY — 55 items
BARRIER ADHS 3X4 INTERCEED (GAUZE/BANDAGES/DRESSINGS) IMPLANT
CATH FOLEY 3WAY  5CC 16FR (CATHETERS) ×2
CATH FOLEY 3WAY 5CC 16FR (CATHETERS) ×2 IMPLANT
CELL SAVER LIPIGURD (MISCELLANEOUS) IMPLANT
CLOTH BEACON ORANGE TIMEOUT ST (SAFETY) ×4 IMPLANT
CONT PATH 16OZ SNAP LID 3702 (MISCELLANEOUS) ×4 IMPLANT
COVER BACK TABLE 60X90IN (DRAPES) ×8 IMPLANT
COVER TIP SHEARS 8 DVNC (MISCELLANEOUS) ×2 IMPLANT
COVER TIP SHEARS 8MM DA VINCI (MISCELLANEOUS) ×2
DECANTER SPIKE VIAL GLASS SM (MISCELLANEOUS) ×12 IMPLANT
DURAPREP 26ML APPLICATOR (WOUND CARE) ×4 IMPLANT
ELECT REM PT RETURN 9FT ADLT (ELECTROSURGICAL) ×4
ELECTRODE REM PT RTRN 9FT ADLT (ELECTROSURGICAL) ×2 IMPLANT
EXTRT SYSTEM ALEXIS 14CM (MISCELLANEOUS)
GAUZE VASELINE 3X9 (GAUZE/BANDAGES/DRESSINGS) IMPLANT
GLOVE BIO SURGEON STRL SZ7.5 (GLOVE) ×12 IMPLANT
GLOVE BIOGEL PI IND STRL 7.0 (GLOVE) ×4 IMPLANT
GLOVE BIOGEL PI INDICATOR 7.0 (GLOVE) ×4
KIT ACCESSORY DA VINCI DISP (KITS) ×2
KIT ACCESSORY DVNC DISP (KITS) ×2 IMPLANT
LEGGING LITHOTOMY PAIR STRL (DRAPES) ×4 IMPLANT
LIQUID BAND (GAUZE/BANDAGES/DRESSINGS) ×4 IMPLANT
NEEDLE INSUFFLATION 150MM (ENDOMECHANICALS) ×4 IMPLANT
OCCLUDER COLPOPNEUMO (BALLOONS) ×4 IMPLANT
PACK ROBOT WH (CUSTOM PROCEDURE TRAY) ×4 IMPLANT
PACK ROBOTIC GOWN (GOWN DISPOSABLE) ×4 IMPLANT
PACK TRENDGUARD 450 HYBRID PRO (MISCELLANEOUS) ×2 IMPLANT
PACK TRENDGUARD 600 HYBRD PROC (MISCELLANEOUS) IMPLANT
PAD PREP 24X48 CUFFED NSTRL (MISCELLANEOUS) ×4 IMPLANT
POUCH LAPAROSCOPIC INSTRUMENT (MISCELLANEOUS) IMPLANT
PROTECTOR NERVE ULNAR (MISCELLANEOUS) ×8 IMPLANT
SET CYSTO W/LG BORE CLAMP LF (SET/KITS/TRAYS/PACK) IMPLANT
SET IRRIG TUBING LAPAROSCOPIC (IRRIGATION / IRRIGATOR) ×4 IMPLANT
SET TRI-LUMEN FLTR TB AIRSEAL (TUBING) ×4 IMPLANT
SUT VIC AB 0 CT1 27 (SUTURE) ×4
SUT VIC AB 0 CT1 27XBRD ANBCTR (SUTURE) ×4 IMPLANT
SUT VICRYL 0 UR6 27IN ABS (SUTURE) ×4 IMPLANT
SUT VICRYL RAPIDE 4/0 PS 2 (SUTURE) ×8 IMPLANT
SUT VLOC 180 0 9IN  GS21 (SUTURE) ×2
SUT VLOC 180 0 9IN GS21 (SUTURE) ×2 IMPLANT
SYR 50ML LL SCALE MARK (SYRINGE) ×4 IMPLANT
SYRINGE 10CC LL (SYRINGE) ×4 IMPLANT
TIP RUMI ORANGE 6.7MMX12CM (TIP) IMPLANT
TIP UTERINE 5.1X6CM LAV DISP (MISCELLANEOUS) IMPLANT
TIP UTERINE 6.7X10CM GRN DISP (MISCELLANEOUS) IMPLANT
TIP UTERINE 6.7X6CM WHT DISP (MISCELLANEOUS) IMPLANT
TIP UTERINE 6.7X8CM BLUE DISP (MISCELLANEOUS) IMPLANT
TOWEL OR 17X24 6PK STRL BLUE (TOWEL DISPOSABLE) ×12 IMPLANT
TRENDGUARD 450 HYBRID PRO PACK (MISCELLANEOUS) ×4
TRENDGUARD 600 HYBRID PROC PK (MISCELLANEOUS)
TROCAR DISP BLADELESS 8 DVNC (TROCAR) ×2 IMPLANT
TROCAR DISP BLADELESS 8MM (TROCAR) ×2
TROCAR PORT AIRSEAL 5X120 (TROCAR) ×4 IMPLANT
TROCAR Z-THREAD 12X150 (TROCAR) ×4 IMPLANT
WATER STERILE IRR 1000ML POUR (IV SOLUTION) ×4 IMPLANT

## 2016-09-18 NOTE — Anesthesia Postprocedure Evaluation (Signed)
Anesthesia Post Note  Patient: Tina Hart  Procedure(s) Performed: Procedure(s) (LRB): ROBOTIC ASSISTED TOTAL HYSTERECTOMY WITH SALPINGECTOMY (Bilateral) OOPHORECTOMY (Left)  Patient location during evaluation: PACU Anesthesia Type: General Level of consciousness: awake and alert Pain management: pain level controlled Vital Signs Assessment: post-procedure vital signs reviewed and stable Respiratory status: spontaneous breathing, nonlabored ventilation, respiratory function stable and patient connected to nasal cannula oxygen Cardiovascular status: blood pressure returned to baseline and stable Postop Assessment: no signs of nausea or vomiting Anesthetic complications: yes Anesthetic complication details: Non painful right eye but does feel like foreign object present in the right corner of right eye. Pt placed on corneal abrasion drops and instructed to contact opthalmologist if pain persists. and injury of cornea    Last Vitals:  Vitals:   09/18/16 1715 09/18/16 1736  BP: 134/75 133/64  Pulse: 77 72  Resp: 19 16  Temp: 37.1 C 36.8 C    Last Pain:  Vitals:   09/18/16 1736  TempSrc:   PainSc: 3    Pain Goal: Patients Stated Pain Goal: 0 (09/18/16 1736)               Tiajuana Amass

## 2016-09-18 NOTE — Transfer of Care (Signed)
Immediate Anesthesia Transfer of Care Note  Patient: Tina Hart  Procedure(s) Performed: Procedure(s): ROBOTIC ASSISTED TOTAL HYSTERECTOMY WITH SALPINGECTOMY (Bilateral) OOPHORECTOMY (Left)  Patient Location: PACU  Anesthesia Type:General  Level of Consciousness: awake, alert  and oriented  Airway & Oxygen Therapy: Patient Spontanous Breathing and Patient connected to nasal cannula oxygen  Post-op Assessment: Report given to RN and Post -op Vital signs reviewed and stable  Post vital signs: Reviewed and stable  Last Vitals:  Vitals:   09/18/16 1134 09/18/16 1538  BP: 140/88   Pulse: 70 83  Resp: 15   Temp: 36.7 C 36.8 C    Last Pain:  Vitals:   09/18/16 1134  TempSrc: Oral      Patients Stated Pain Goal: 4 ([phone])  Complications: No apparent anesthesia complications

## 2016-09-18 NOTE — Addendum Note (Signed)
Addendum  created 09/18/16 2015 by Asher Muir, CRNA   Charge Capture section accepted

## 2016-09-18 NOTE — Op Note (Signed)
09/18/2016  3:26 PM  PATIENT:  Tina Hart  40 y.o. female  PRE-OPERATIVE DIAGNOSIS:  Dysmenorrhea, Failed Endometrial Ablation  POST-OPERATIVE DIAGNOSIS:  Dysmenorrhea, Failed Endometrial Ablation, Pelvic endometriosis  PROCEDURE:  Procedure(s): ROBOTIC ASSISTED TOTAL HYSTERECTOMY WITH BILATERAL SALPINGECTOMY OOPHORECTOMY- LEFT RIGHT OVARIAN CYSTECTOMY LYSIS OF OMENTAL ADHESIONS, LEFT AND RIGHT OVARIAN ADHESIONS, ANTERIOR CUL DE SAC ADHESIONS, POSTERIOR CUL DE SAC ADHESIONS MCCALL CUL DE PLASTY ABLATION OF CUL DE [place] ENDOMETRIOSIS EXCISION OF CUL DE [place] ENDOMETRIOSIS  SURGEON:  Surgeon(s): Brien Few, MD  ASSISTANTS: Renato Battles, CNM   ANESTHESIA:   local and general  ESTIMATED BLOOD LOSS: 100  DRAINS: Urinary Catheter (Foley)   LOCAL MEDICATIONS USED:  MARCAINE    and Amount: 20 ml  SPECIMEN:  Source of Specimen:  uterus, cervix. tubal segments, right ovarian cyst, left ovary  DISPOSITION OF SPECIMEN:  PATHOLOGY  COUNTS:  YES  DICTATION #: NUMBER NOT GIVEN  PLAN OF CARE: 23 hr extended stay  PATIENT DISPOSITION:  PACU - hemodynamically stable.

## 2016-09-18 NOTE — Progress Notes (Signed)
Patient seen and examined. Consent witnessed and signed. No changes noted. Update completed.Patient ID: Tina Hart, female   DOB: [birth year], 40 y.o.   MRN: [id number]

## 2016-09-18 NOTE — Anesthesia Procedure Notes (Signed)
Procedure Name: Intubation Date/Time: 09/18/2016 12:47 PM Performed by: Riki Sheer Pre-anesthesia Checklist: Patient identified, Emergency Drugs available, Suction available, Patient being monitored and Timeout performed Patient Re-evaluated:Patient Re-evaluated prior to inductionOxygen Delivery Method: Circle system utilized Preoxygenation: Pre-oxygenation with 100% oxygen Intubation Type: IV induction Ventilation: Mask ventilation without difficulty Laryngoscope Size: Mac and 3 Grade View: Grade III Tube type: Oral Tube size: 7.0 mm Number of attempts: 1 Airway Equipment and Method: Stylet Placement Confirmation: ETT inserted through vocal cords under direct vision,  positive ETCO2,  CO2 detector and breath sounds checked- equal and bilateral Secured at: 21 cm Tube secured with: Tape Dental Injury: Teeth and Oropharynx as per pre-operative assessment and Bloody posterior oropharynx  Difficulty Due To: Difficulty was unanticipated and Difficult Airway- due to anterior larynx Future Recommendations: Recommend- induction with short-acting agent, and alternative techniques readily available Comments: Grade 3 view with DL x1. Larynx was very anterior. Was able to pass ETT, but recommend glidescope or bougie ready for next induction.

## 2016-09-18 NOTE — Anesthesia Preprocedure Evaluation (Addendum)
Anesthesia Evaluation  Patient identified by MRN, date of birth, ID band Patient awake    Reviewed: Allergy & Precautions, NPO status , Patient's Chart, lab work & pertinent test results  History of Anesthesia Complications (+) PONV and history of anesthetic complications  Airway Mallampati: II       Dental no notable dental hx. (+) Teeth Intact   Pulmonary neg pulmonary ROS,    Pulmonary exam normal breath sounds clear to auscultation       Cardiovascular Exercise Tolerance: Good negative cardio ROS Normal cardiovascular exam Rhythm:Regular Rate:Normal     Neuro/Psych  Headaches, PSYCHIATRIC DISORDERS Anxiety Depression    GI/Hepatic Neg liver ROS, GERD  Medicated and Controlled,  Endo/Other  Hypothyroidism Hyperthyroidism Obesity  Renal/GU negative Renal ROS  negative genitourinary   Musculoskeletal negative musculoskeletal ROS (+)   Abdominal (+) + obese,   Peds  Hematology negative hematology ROS (+)   Anesthesia Other Findings   Reproductive/Obstetrics Dysmenorrhea Failed endometrial ablation HSV                            Lab Results  Component Value Date   WBC 9.4 09/12/2016   HGB 13.3 09/12/2016   HCT 40.5 09/12/2016   MCV 84.6 09/12/2016   PLT 319 09/12/2016     Chemistry      Component Value Date/Time   NA 136 09/12/2016 1027   K 4.1 09/12/2016 1027   CL 104 09/12/2016 1027   CO2 26 09/12/2016 1027   BUN 7 09/12/2016 1027   CREATININE 0.74 09/12/2016 1027   CREATININE 0.71 10/11/2015 1042      Component Value Date/Time   CALCIUM 8.8 (L) 09/12/2016 1027   ALKPHOS 59 10/11/2015 1042   AST 10 10/11/2015 1042   ALT 8 10/11/2015 1042   BILITOT 0.3 10/11/2015 1042      Anesthesia Physical Anesthesia Plan  ASA: II  Anesthesia Plan: General   Post-op Pain Management:    Induction: Intravenous  Airway Management Planned: Oral ETT  Additional Equipment:    Intra-op Plan:   Post-operative Plan: Extubation in OR  Informed Consent: I have reviewed the patients History and Physical, chart, labs and discussed the procedure including the risks, benefits and alternatives for the proposed anesthesia with the patient or authorized representative who has indicated his/her understanding and acceptance.   Dental advisory given  Plan Discussed with: Anesthesiologist, CRNA and Surgeon  Anesthesia Plan Comments:         Anesthesia Quick Evaluation

## 2016-09-19 ENCOUNTER — Encounter (HOSPITAL_COMMUNITY): Payer: Self-pay | Admitting: Obstetrics and Gynecology

## 2016-09-19 DIAGNOSIS — Z881 Allergy status to other antibiotic agents status: Secondary | ICD-10-CM | POA: Diagnosis not present

## 2016-09-19 DIAGNOSIS — N946 Dysmenorrhea, unspecified: Secondary | ICD-10-CM | POA: Diagnosis not present

## 2016-09-19 DIAGNOSIS — K219 Gastro-esophageal reflux disease without esophagitis: Secondary | ICD-10-CM | POA: Diagnosis not present

## 2016-09-19 DIAGNOSIS — E039 Hypothyroidism, unspecified: Secondary | ICD-10-CM | POA: Diagnosis not present

## 2016-09-19 DIAGNOSIS — N8302 Follicular cyst of left ovary: Secondary | ICD-10-CM | POA: Diagnosis not present

## 2016-09-19 DIAGNOSIS — N800 Endometriosis of uterus: Secondary | ICD-10-CM | POA: Diagnosis not present

## 2016-09-19 DIAGNOSIS — N736 Female pelvic peritoneal adhesions (postinfective): Secondary | ICD-10-CM | POA: Diagnosis not present

## 2016-09-19 DIAGNOSIS — Z5189 Encounter for other specified aftercare: Secondary | ICD-10-CM | POA: Diagnosis not present

## 2016-09-19 DIAGNOSIS — E669 Obesity, unspecified: Secondary | ICD-10-CM | POA: Diagnosis not present

## 2016-09-19 DIAGNOSIS — E0590 Thyrotoxicosis, unspecified without thyrotoxic crisis or storm: Secondary | ICD-10-CM | POA: Diagnosis not present

## 2016-09-19 DIAGNOSIS — N72 Inflammatory disease of cervix uteri: Secondary | ICD-10-CM | POA: Diagnosis not present

## 2016-09-19 DIAGNOSIS — F329 Major depressive disorder, single episode, unspecified: Secondary | ICD-10-CM | POA: Diagnosis not present

## 2016-09-19 DIAGNOSIS — F419 Anxiety disorder, unspecified: Secondary | ICD-10-CM | POA: Diagnosis not present

## 2016-09-19 DIAGNOSIS — Z6835 Body mass index (BMI) 35.0-35.9, adult: Secondary | ICD-10-CM | POA: Diagnosis not present

## 2016-09-19 DIAGNOSIS — Z882 Allergy status to sulfonamides status: Secondary | ICD-10-CM | POA: Diagnosis not present

## 2016-09-19 DIAGNOSIS — N803 Endometriosis of pelvic peritoneum: Secondary | ICD-10-CM | POA: Diagnosis not present

## 2016-09-19 DIAGNOSIS — N920 Excessive and frequent menstruation with regular cycle: Secondary | ICD-10-CM | POA: Diagnosis not present

## 2016-09-19 LAB — CBC
HEMATOCRIT: 34.3 % — AB (ref 36.0–46.0)
Hemoglobin: 11.1 g/dL — ABNORMAL LOW (ref 12.0–15.0)
MCH: 27 pg (ref 26.0–34.0)
MCHC: 32.4 g/dL (ref 30.0–36.0)
MCV: 83.5 fL (ref 78.0–100.0)
PLATELETS: 254 10*3/uL (ref 150–400)
RBC: 4.11 MIL/uL (ref 3.87–5.11)
RDW: 14.1 % (ref 11.5–15.5)
WBC: 15.1 10*3/uL — AB (ref 4.0–10.5)

## 2016-09-19 LAB — BASIC METABOLIC PANEL
Anion gap: 5 (ref 5–15)
CALCIUM: 8.4 mg/dL — AB (ref 8.9–10.3)
CO2: 25 mmol/L (ref 22–32)
CREATININE: 0.65 mg/dL (ref 0.44–1.00)
Chloride: 108 mmol/L (ref 101–111)
GFR calc Af Amer: >60 mL/min (ref >60)
GLUCOSE: 132 mg/dL — AB (ref 65–99)
Potassium: 3.7 mmol/L (ref 3.5–5.1)
Sodium: 138 mmol/L (ref 135–145)

## 2016-09-19 MED ORDER — OXYCODONE-ACETAMINOPHEN 5-325 MG PO TABS: 1.0000 | ORAL_TABLET | ORAL | 0 refills | Status: DC | PRN

## 2016-09-19 MED ORDER — TRAMADOL HCL 50 MG PO TABS: 50.0000 mg | ORAL_TABLET | Freq: Four times a day (QID) | ORAL | 0 refills | Status: DC | PRN

## 2016-09-19 NOTE — Op Note (Signed)
Tina Hart, Tina Hart              ACCOUNT NO.:  [id number]  MEDICAL RECORD NO.:  [id number]  LOCATION:  9302                          FACILITY:  [facility]  PHYSICIAN:  Lovenia Kim, M.D.DATE OF BIRTH:  [birth year]  DATE OF PROCEDURE: DATE OF DISCHARGE:                              OPERATIVE REPORT   PREOPERATIVE DIAGNOSES:  Dysmenorrhea, pelvic pain, failed endometrial ablation.  POSTOPERATIVE DIAGNOSES:  Dysmenorrhea, pelvic pain, failed endometrial ablation plus pelvic endometriosis and significant pelvic adhesions.  PROCEDURE:  Robotically assisted total laparoscopic hysterectomy, bilateral salpingectomy, left oophorectomy, right ovarian cystectomy, lysis of significant adhesions to include omental to anterior abdominal wall adhesions, left and right periovarian adhesions to the right and left ovarian fossa, and left ovarian adhesions to the posterior cul-de- sac, lysis of the anterior cul-de-sac adhesions to the bladder, lysis of posterior cul-de-sac adhesions of the sigmoid and rectosigmoid to the posterior uterine wall, McCall culdoplasty, ablation of the cul-de-sac endometriosis, and excision of cul-de-sac endometriosis.  SURGEON:  Lovenia Kim, M.D.  ASSISTANT:  Renato Battles, CNM.  ESTIMATED BLOOD LOSS:  100 mL.  COMPLICATIONS:  None.  DRAINS:  Foley.  COUNTS:  Correct.  DISPOSITION:  The patient to recovery in good condition.  OPERATIVE NOTE:  After being apprised of the risks of anesthesia, infection, bleeding, injury to surrounding organs, possible need for repair, delayed versus immediate complications to include bowel and bladder injury, possible need for repair, the patient was brought to the operating room, where she was administered general anesthetic without complications.  Prepped and draped in usual sterile fashion.  Foley catheter placed.  Exam under anesthesia revealed a retroflexed uterus with no adnexal masses appreciated at this time.  Due to  previous endometrial ablation, cervix was dilated, but RUMI retractor was placed gently a 6 mm RUMI retractor into the uterus.  Balloon was unable to be inflated.  The anterior and posterior cervical lip were both sutured to the RUMI retractor.  At this time, an infraumbilical incision was made with a scalpel.  Veress needle placed, opening pressure of -2, 3 L CO2 insufflated without difficulty.  Atraumatic trocar entry noted. Visualization reveals some anterior abdominal wall adhesions in multiple areas to the omentum on the left and right to the left adnexa on the left and the left pelvic sidewall.  A 2 axillary points were made on the left and 2 on the right.  A 5 mm port placed and 3 robotic trocars placed all under direct visualization.  AirSeal was then established and deep Trendelenburg position was established.  The anterior abdominal wall adhesions were all lysed sharply using Endo Shears freeing the bowel from its attachments to the anterior abdominal wall into the left adnexa.  The bowel sharply dissected from the left tube and ovary and easily dissected  taking care to avoid the left rectosigmoid. In the cul-de-sac, the left ovary was completely adhesed to the back wall of the uterus by endometriosis.  This was felt to be difficult to resect and decision was made to remove the left ovary, however, the left ovarian attachments off the bowel posteriorly were done without difficulty.  Cul-de-sac adhesions from the bowel to rectosigmoid to the posterior wall of  the uterus were dissected without difficulty as well. On the right, there were adhesions of the right ovary to the right pelvic sidewall and also to the right posterior uterine wall, these were lysed sharply.  The ureter was identified on the right and left to be peristalsing normally.  In the anterior cul-de-sac, there were significant adhesions to the bladder flap to the mid uterine and fundal area.  These were lysed  sharply.  On the left side, retroperitoneal space was entered cephalad to the round ligament and the retroperitoneal space was dissected skeletonizing the left infundibulopelvic ligament noting the ureter below.  A window was made and the infundibulopelvic ligament was dissected, cauterized using a bipolar cautery and divided. The round ligament was divided on the left.  The bladder flap was sharply developed on the left skeletonizing the left uterine vessels were cauterized, but not cut.  The posterior cul-de-sac was cleared of adhesions.  The left ovary was now further lysed from its attachments to the uterus and attention was turned to the right side where the periovarian adhesions were further lysed sharply and retroperitoneal space was entered.  The tubo-ovarian ligament was cut.  The right tube was further dissected and the distal portion was dissected and removed separately due to previous tubal ligation.  The right ovary was seen to have a simple ovarian cyst, which was excised and the cyst sac placed in the posterior cul-de-sac as well.  The right ovary appears otherwise normal and hemostatic after dissecting its adhesions and it remains intact.  On the right side, round ligament was opened and divided and the retroperitoneal space was further divided noting the right ureter. The right uterine vessels were skeletonized, cauterized, and cut.  The bladder flap was developed sharply.  The posterior uterine wall was reflected upon itself posteriorly, this was dissected so that the cervicovaginal junction that were identified posteriorly. After cleaning anteriorly the bladder flap sharply distal to the RUMI cup, the uterine vessels were skeletonized and cut on the left and the specimen was detached circumferentially 360 degrees at the cervicovaginal junction.  The specimen was easily retracted in the vagina.  The vagina was closed using a 0 Vicryl braided suture in a continuous  running fashion.  A second imbricating layer was placed and McCall culdoplasty sutures placed as well.  Vaginal exam by the assistant reveals the vagina to be intact at this time.  Rectal exam reveals the rectum and rectosigmoid to be void of any defects and intact as well.  Irrigation was accomplished.  All specimens having been removed vaginally including tubal segments and ovarian cyst wall.  At this time, good hemostasis was noted.  Irrigation was accomplished. Instruments were all removed under direct visualization.  CO2 was released.  Positive pressure applied.  Incisions were closed using 0 Vicryl, 4-0 Vicryl, and Dermabond.  Dilute Marcaine solution placed. Ropivacaine solution has been previously placed in the pelvis during surgery.  Repeat vaginal exam reveals an intact vaginal vault.  Repeat rectal exam reveals no evidence of loss of integrity.  Urine was clear. Ureters were seen peristalsing bilaterally at the end of the procedure. The patient tolerated the procedure well, was awakened, and transferred to recovery in good condition.     Lovenia Kim, M.D.     RJT/MEDQ  D:  09/18/2016  T:  09/19/2016  Job:  [id number]  cc:   [facility].

## 2016-09-19 NOTE — Progress Notes (Signed)
Out in wheelchair teaching complete 

## 2016-09-19 NOTE — Progress Notes (Signed)
1 Day Post-Op Procedure(s) (LRB): ROBOTIC ASSISTED TOTAL HYSTERECTOMY WITH SALPINGECTOMY (Bilateral) OOPHORECTOMY (Left)  Subjective: Patient reports nausea, vomiting, incisional pain, tolerating PO, + flatus and no problems voiding.    Objective: BP (!) 116/59 (BP Location: Left Arm)   Pulse 96   Temp 98.2 F (36.8 C) (Oral)   Resp 20   LMP 06/13/2016   SpO2 98%   I have reviewed patient's vital signs, medications and labs.  General: alert, cooperative and appears stated age Resp: clear to auscultation bilaterally and normal percussion bilaterally Cardio: regular rate and rhythm, S1, S2 normal, no murmur, click, rub or gallop GI: soft, non-tender; bowel sounds normal; no masses,  no organomegaly and incision: clean, dry and intact Extremities: extremities normal, atraumatic, no cyanosis or edema Vaginal Bleeding: minimal  CBC    Component Value Date/Time   WBC 9.4 09/12/2016 1027   RBC 4.79 09/12/2016 1027   HGB 13.3 09/12/2016 1027   HCT 40.5 09/12/2016 1027   PLT 319 09/12/2016 1027   MCV 84.6 09/12/2016 1027   MCH 27.8 09/12/2016 1027   MCHC 32.8 09/12/2016 1027   RDW 14.0 09/12/2016 1027   LYMPHSABS 2.1 10/11/2015 1042   MONOABS 0.6 10/11/2015 1042   EOSABS 0.2 10/11/2015 1042   BASOSABS 0.0 10/11/2015 1042    Assessment: s/p Procedure(s): ROBOTIC ASSISTED TOTAL HYSTERECTOMY WITH SALPINGECTOMY (Bilateral) OOPHORECTOMY (Left): stable, progressing well and tolerating diet  Plan: Advance diet Encourage ambulation Advance to PO medication Discontinue IV fluids Discharge home  LOS: 0 days    Shikita Vaillancourt J 09/19/2016, 6:07 AM

## 2016-09-19 NOTE — Anesthesia Postprocedure Evaluation (Signed)
Anesthesia Post Note  Patient: Marshe Babiak  Procedure(s) Performed: Procedure(s) (LRB): ROBOTIC ASSISTED TOTAL HYSTERECTOMY WITH SALPINGECTOMY (Bilateral) OOPHORECTOMY (Left)  Patient location during evaluation: [facility] Anesthesia Type: General Level of consciousness: awake Pain management: pain level controlled (PCA intact) Vital Signs Assessment: post-procedure vital signs reviewed and stable Respiratory status: spontaneous breathing and patient connected to nasal cannula oxygen (2L/[place] with SaO2 98%) Cardiovascular status: stable Postop Assessment: no signs of nausea or vomiting Anesthetic complications: no     Last Vitals:  Vitals:   09/19/16 0519 09/19/16 0601  BP: (!) 116/59   Pulse: 96   Resp: 19 20  Temp: 36.8 C     Last Pain:  Vitals:   09/19/16 0601  TempSrc:   PainSc: 4    Pain Goal: Patients Stated Pain Goal: 0 (09/18/16 2000)               Everette Rank

## 2016-09-19 NOTE — Addendum Note (Signed)
Addendum  created 09/19/16 0809 by Georgeanne Nim, CRNA   Sign clinical note

## 2017-01-23 ENCOUNTER — Encounter: Payer: Self-pay | Admitting: Physician Assistant

## 2017-01-23 ENCOUNTER — Ambulatory Visit (INDEPENDENT_AMBULATORY_CARE_PROVIDER_SITE_OTHER): Payer: BLUE CROSS/BLUE SHIELD | Admitting: Physician Assistant

## 2017-01-23 VITALS — BP 123/76 | HR 72 | Temp 98.2°F | Ht 62.0 in | Wt 194.0 lb

## 2017-01-23 DIAGNOSIS — J069 Acute upper respiratory infection, unspecified: Secondary | ICD-10-CM | POA: Diagnosis not present

## 2017-01-23 DIAGNOSIS — R52 Pain, unspecified: Secondary | ICD-10-CM | POA: Diagnosis not present

## 2017-01-23 LAB — POCT INFLUENZA A/B
Influenza A, POC: NEGATIVE
Influenza B, POC: NEGATIVE

## 2017-01-23 MED ORDER — BENZONATATE 100 MG PO CAPS: 200.0000 mg | ORAL_CAPSULE | Freq: Three times a day (TID) | ORAL | 0 refills | Status: DC | PRN

## 2017-01-23 NOTE — Progress Notes (Signed)
   Subjective:    Patient ID: Tina Hart, female    DOB: 1976/09/21, 41 y.o.   MRN: [id number]  HPI  Pt is a 41 yo female who presents to the clinic with body aches, chills, low grade fever for 2 days. On Sunday she ran a 5K. Monday symptoms started. Motrin and tylenol made her feel some better. Today started with dry cough, sinus congestion. Denies SOB, wheezing, ST, ear pain, sinus pressure.    Review of Systems  All other systems reviewed and are negative.      Objective:   Physical Exam  Constitutional: She is oriented to person, place, and time. She appears well-developed and well-nourished.  HENT:  Head: Normocephalic and atraumatic.  Right Ear: External ear normal.  Left Ear: External ear normal.  Nose: Nose normal.  TM's slightly dull with light reflex.  No tenderness over sinuses.    Eyes: Conjunctivae are normal. Right eye exhibits no discharge. Left eye exhibits no discharge.  Neck: Normal range of motion. Neck supple. No thyromegaly present.  Cardiovascular: Normal rate, regular rhythm and normal heart sounds.   Pulmonary/Chest: Effort normal and breath sounds normal. She has no wheezes.  Lymphadenopathy:    She has no cervical adenopathy.  Neurological: She is alert and oriented to person, place, and time.  Psychiatric: She has a normal mood and affect. Her behavior is normal.          Assessment & Plan:  Marland KitchenMarland KitchenDiagnoses and all orders for this visit:  Acute upper respiratory infection -     benzonatate (TESSALON) 100 MG capsule; Take 2 capsules (200 mg total) by mouth 3 (three) times daily as needed for cough.  Body aches -     POCT Influenza A/B  flu was negative. discussed likely viral. Tessalon pearles given. Given supportive care with tylenol cold sinus severe, hydration, rest. Follow up as needed.

## 2017-01-23 NOTE — Patient Instructions (Signed)
Tessalon pearls for cough.   Upper Respiratory Infection, Adult Most upper respiratory infections (URIs) are caused by a virus. A URI affects the nose, throat, and upper air passages. The most common type of URI is often called "the common cold." Follow these instructions at home:  Take medicines only as told by your doctor.  Gargle warm saltwater or take cough drops to comfort your throat as told by your doctor.  Use a warm mist humidifier or inhale steam from a shower to increase air moisture. This may make it easier to breathe.  Drink enough fluid to keep your pee (urine) clear or pale yellow.  Eat soups and other clear broths.  Have a healthy diet.  Rest as needed.  Go back to work when your fever is gone or your doctor says it is okay.  You may need to stay home longer to avoid giving your URI to others.  You can also wear a face mask and wash your hands often to prevent spread of the virus.  Use your inhaler more if you have asthma.  Do not use any tobacco products, including cigarettes, chewing tobacco, or electronic cigarettes. If you need help quitting, ask your doctor. Contact a doctor if:  You are getting worse, not better.  Your symptoms are not helped by medicine.  You have chills.  You are getting more short of breath.  You have brown or red mucus.  You have yellow or brown discharge from your nose.  You have pain in your face, especially when you bend forward.  You have a fever.  You have puffy (swollen) neck glands.  You have pain while swallowing.  You have white areas in the back of your throat. Get help right away if:  You have very bad or constant:  Headache.  Ear pain.  Pain in your forehead, behind your eyes, and over your cheekbones (sinus pain).  Chest pain.  You have long-lasting (chronic) lung disease and any of the following:  Wheezing.  Long-lasting cough.  Coughing up blood.  A change in your usual mucus.  You have  a stiff neck.  You have changes in your:  Vision.  Hearing.  Thinking.  Mood. This information is not intended to replace advice given to you by your health care provider. Make sure you discuss any questions you have with your health care provider. Document Released: 04/17/2008 Document Revised: 07/02/2016 Document Reviewed: 02/04/2014 Elsevier Interactive Patient Education  2017 [employer].

## 2017-01-26 ENCOUNTER — Encounter: Payer: Self-pay | Admitting: Family Medicine

## 2017-01-26 ENCOUNTER — Ambulatory Visit (INDEPENDENT_AMBULATORY_CARE_PROVIDER_SITE_OTHER): Payer: BLUE CROSS/BLUE SHIELD | Admitting: Family Medicine

## 2017-01-26 VITALS — BP 126/76 | HR 77 | Temp 99.7°F | Ht 62.0 in | Wt 195.0 lb

## 2017-01-26 DIAGNOSIS — J208 Acute bronchitis due to other specified organisms: Secondary | ICD-10-CM | POA: Diagnosis not present

## 2017-01-26 DIAGNOSIS — J0120 Acute ethmoidal sinusitis, unspecified: Secondary | ICD-10-CM | POA: Diagnosis not present

## 2017-01-26 MED ORDER — AMOXICILLIN-POT CLAVULANATE 875-125 MG PO TABS: 1.0000 | ORAL_TABLET | Freq: Two times a day (BID) | ORAL | 0 refills | Status: DC

## 2017-01-26 NOTE — Patient Instructions (Addendum)

## 2017-01-26 NOTE — Progress Notes (Signed)
   Subjective:    Patient ID: Tina Hart, female    DOB: 11/30/1975, 41 y.o.   MRN: [id number]  HPI 41 year old female comes in today with 5 days of upper respiratory symptoms. She was actually seen about 2 days ago in our office and after testing negative for the flu was treated for a viral illness. She is coming in today because she is not feeling better.She still has a lot of nasal congestion with facial pressure over the nasal bridge and now it's moving into her chest and she is coughing. She is getting a lot of sputum production. She has a sore throat and just doesn't feel well. She actually feels like she's gotten worse in the last 2 days.   Review of Systems     Objective:   Physical Exam  Constitutional: She is oriented to person, place, and time. She appears well-developed and well-nourished.  HENT:  Head: Normocephalic and atraumatic.  Right Ear: External ear normal.  Left Ear: External ear normal.  Nose: Nose normal.  Mouth/Throat: Oropharynx is clear and moist.  TMs and canals are clear.   Eyes: Conjunctivae and EOM are normal. Pupils are equal, round, and reactive to light.  Neck: Neck supple. No thyromegaly present.  Cardiovascular: Normal rate, regular rhythm and normal heart sounds.   Pulmonary/Chest: Effort normal and breath sounds normal. She has no wheezes.  Lymphadenopathy:    She has no cervical adenopathy.  Neurological: She is alert and oriented to person, place, and time.  Skin: Skin is warm and dry.  Psychiatric: She has a normal mood and affect.          Assessment & Plan:  Acute sinusitis/bronchitis-we'll go ahead and treat with Augmentin. Make sure hydrating well. Consider trial of over-the-counter Mucinex and if not improving over the next week then give [place] a call back.

## 2017-03-31 DIAGNOSIS — H65191 Other acute nonsuppurative otitis media, right ear: Secondary | ICD-10-CM | POA: Diagnosis not present

## 2017-03-31 DIAGNOSIS — H60391 Other infective otitis externa, right ear: Secondary | ICD-10-CM | POA: Diagnosis not present

## 2017-04-05 DIAGNOSIS — R413 Other amnesia: Secondary | ICD-10-CM | POA: Diagnosis not present

## 2017-04-05 DIAGNOSIS — E349 Endocrine disorder, unspecified: Secondary | ICD-10-CM | POA: Diagnosis not present

## 2017-04-05 DIAGNOSIS — R51 Headache: Secondary | ICD-10-CM | POA: Diagnosis not present

## 2017-04-05 DIAGNOSIS — R5383 Other fatigue: Secondary | ICD-10-CM | POA: Diagnosis not present

## 2017-06-07 DIAGNOSIS — G4700 Insomnia, unspecified: Secondary | ICD-10-CM | POA: Diagnosis not present

## 2017-06-07 DIAGNOSIS — E349 Endocrine disorder, unspecified: Secondary | ICD-10-CM | POA: Diagnosis not present

## 2017-06-07 DIAGNOSIS — N951 Menopausal and female climacteric states: Secondary | ICD-10-CM | POA: Diagnosis not present

## 2017-06-07 DIAGNOSIS — N958 Other specified menopausal and perimenopausal disorders: Secondary | ICD-10-CM | POA: Diagnosis not present

## 2017-06-19 DIAGNOSIS — D225 Melanocytic nevi of trunk: Secondary | ICD-10-CM | POA: Diagnosis not present

## 2017-06-29 DIAGNOSIS — Z01419 Encounter for gynecological examination (general) (routine) without abnormal findings: Secondary | ICD-10-CM | POA: Diagnosis not present

## 2017-06-29 DIAGNOSIS — Z6836 Body mass index (BMI) 36.0-36.9, adult: Secondary | ICD-10-CM | POA: Diagnosis not present

## 2017-06-29 DIAGNOSIS — Z13220 Encounter for screening for lipoid disorders: Secondary | ICD-10-CM | POA: Diagnosis not present

## 2017-06-29 DIAGNOSIS — E039 Hypothyroidism, unspecified: Secondary | ICD-10-CM | POA: Diagnosis not present

## 2017-06-29 DIAGNOSIS — Z1231 Encounter for screening mammogram for malignant neoplasm of breast: Secondary | ICD-10-CM | POA: Diagnosis not present

## 2017-09-21 DIAGNOSIS — E349 Endocrine disorder, unspecified: Secondary | ICD-10-CM | POA: Diagnosis not present

## 2017-09-21 DIAGNOSIS — G4700 Insomnia, unspecified: Secondary | ICD-10-CM | POA: Diagnosis not present

## 2017-09-21 DIAGNOSIS — R5383 Other fatigue: Secondary | ICD-10-CM | POA: Diagnosis not present

## 2017-09-21 DIAGNOSIS — M810 Age-related osteoporosis without current pathological fracture: Secondary | ICD-10-CM | POA: Diagnosis not present

## 2018-01-25 ENCOUNTER — Encounter: Payer: Self-pay | Admitting: Emergency Medicine

## 2018-01-25 ENCOUNTER — Emergency Department
Admission: EM | Admit: 2018-01-25 | Discharge: 2018-01-25 | Disposition: A | Discharge status: Home / Self Care | Payer: BLUE CROSS/BLUE SHIELD | Source: Home / Self Care | Attending: Family Medicine | Admitting: Family Medicine

## 2018-01-25 DIAGNOSIS — R197 Diarrhea, unspecified: Secondary | ICD-10-CM | POA: Diagnosis not present

## 2018-01-25 NOTE — ED Provider Notes (Signed)
[facility]    CSN: [id number] Arrival date & time: 01/25/18  1758     History   Chief Complaint Chief Complaint  Patient presents with  . Diarrhea    HPI Tina Hart is a 42 y.o. female.   Three days ago patient developed myalgias and sensation of increased temperature.  Yesterday she developed decreased appetite, fatigue, and diarrhea at 11pm.  No abdominal pain.  Denies recent foreign travel, or drinking untreated water in a wilderness environment.  She denies recent antibiotic use.    The history is provided by the patient.  Diarrhea  Quality:  Watery Severity:  Mild Onset quality:  Sudden Duration:  1 day Timing:  Sporadic Progression:  Improving Relieved by:  Nothing Exacerbated by: eating. Ineffective treatments:  None tried Associated symptoms: chills and myalgias   Associated symptoms: no abdominal pain, no arthralgias, no recent cough, no diaphoresis, no headaches, no URI and no vomiting   Risk factors: no recent antibiotic use, no sick contacts, no suspicious food intake and no travel to endemic areas     Past Medical History:  Diagnosis Date  . Anxiety   . GERD (gastroesophageal reflux disease)    WITH PREGNANCY  . Headache   . Hyperthyroidism   . PONV (postoperative nausea and vomiting)   . Post partum depression     Patient Active Problem List   Diagnosis Date Noted  . Endometriosis 09/18/2016  . Plantar fasciitis, bilateral 10/13/2015  . Postpartum care following cesarean delivery & BTL (11/20) 10/02/2014  . HYPOTHYROIDISM 09/04/2011  . Acute upper respiratory infection 12/27/2010  . MIGRAINE HEADACHE 09/29/2010  . HERPES LABIALIS 06/22/2008    Past Surgical History:  Procedure Laterality Date  . CARPAL TUNNEL RELEASE Right 06/2016  . CESAREAN SECTION    . CESAREAN SECTION WITH BILATERAL TUBAL LIGATION Bilateral 10/02/2014   Procedure: Repeat CESAREAN SECTION WITH BILATERAL TUBAL LIGATION;  Surgeon: Lovenia Kim, MD;   Location: [facility];  Service: Obstetrics;  Laterality: Bilateral;  EDD: 10/09/14  . COLONOSCOPY    . ENDOMETRIAL ABLATION W/ NOVASURE  03/2015  . OOPHORECTOMY Left 09/18/2016   Procedure: OOPHORECTOMY;  Surgeon: Brien Few, MD;  Location: [facility];  Service: Gynecology;  Laterality: Left;  . ROBOTIC ASSISTED TOTAL HYSTERECTOMY WITH SALPINGECTOMY Bilateral 09/18/2016   Procedure: ROBOTIC ASSISTED TOTAL HYSTERECTOMY WITH SALPINGECTOMY;  Surgeon: Brien Few, MD;  Location: [facility];  Service: Gynecology;  Laterality: Bilateral;  . WISDOM TOOTH EXTRACTION      OB History    Gravida Para Term Preterm AB Living   2 2 2  0 0 1   SAB TAB Ectopic Multiple Live Births   0 0 0 0 1       Home Medications    Prior to Admission medications   Medication Sig Start Date End Date Taking? Authorizing Provider  amoxicillin-clavulanate (AUGMENTIN) 875-125 MG tablet Take 1 tablet by mouth 2 (two) times daily. 01/26/17   Hali Marry, MD  benzonatate (TESSALON) 100 MG capsule Take 2 capsules (200 mg total) by mouth 3 (three) times daily as needed for cough. 01/23/17   Breeback, Royetta Car, PA-C  levothyroxine (SYNTHROID, LEVOTHROID) 75 MCG tablet Take 75 mcg by mouth daily before breakfast.     [provider]  sertraline (ZOLOFT) 25 MG tablet Take 3 tablets (75 mg total) by mouth daily. 10/05/14   Artelia Laroche, CNM  valACYclovir (VALTREX) 500 MG tablet Take 500 mg by mouth 2 (two) times daily as needed (for  cold sores).    [provider]  ZOLMitriptan (ZOMIG) 2.5 MG tablet Take 2.5 mg by mouth every 2 (two) hours as needed for migraine.     [provider]    Family History History reviewed. No pertinent family history.  Social History Social History   Tobacco Use  . Smoking status: Never Smoker  . Smokeless tobacco: Never Used  Substance Use Topics  . Alcohol use: Yes    Comment: social not during pregnancy  . Drug use: No     Allergies   Erythromycin and  Sulfonamide derivatives   Review of Systems Review of Systems  Constitutional: Positive for chills. Negative for diaphoresis.  Gastrointestinal: Positive for diarrhea. Negative for abdominal pain and vomiting.  Musculoskeletal: Positive for myalgias. Negative for arthralgias.  Neurological: Negative for headaches.  All other systems reviewed and are negative.    Physical Exam Triage Vital Signs ED Triage Vitals  Enc Vitals Group     BP 01/25/18 1820 130/83     Pulse Rate 01/25/18 1820 90     Resp --      Temp 01/25/18 1820 99.7 F (37.6 C)     Temp Source 01/25/18 1820 Oral     SpO2 01/25/18 1820 98 %     Weight 01/25/18 1821 198 lb (89.8 kg)     Height --      Head Circumference --      Peak Flow --      Pain Score 01/25/18 1821 0     Pain Loc --      Pain Edu? --      Excl. in [place]? --    No data found.  Updated Vital Signs BP 130/83 (BP Location: Right Arm)   Pulse 90   Temp 99.7 F (37.6 C) (Oral)   Wt 198 lb (89.8 kg)   LMP 06/13/2016   SpO2 98%   BMI 36.21 kg/m   Visual Acuity Right Eye Distance:   Left Eye Distance:   Bilateral Distance:    Right Eye Near:   Left Eye Near:    Bilateral Near:     Physical Exam Nursing notes and Vital Signs reviewed. Appearance:  Patient appears stated age, and in no acute distress.    Eyes:  Pupils are equal, round, and reactive to light and accomodation.  Extraocular movement is intact.  Conjunctivae are not inflamed   Pharynx:  Normal; moist mucous membranes  Neck:  Supple.  No adenopathy Lungs:  Clear to auscultation.  Breath sounds are equal.  Moving air well. Heart:  Regular rate and rhythm without murmurs, rubs, or gallops.  Abdomen:  Nontender without masses or hepatosplenomegaly.  Bowel sounds are present.  No CVA or flank tenderness.  Extremities:  No edema.  Skin:  No rash present.     UC Treatments / Results  Labs (all labs ordered are listed, but only abnormal results are displayed) Labs Reviewed -  No data to display  EKG  EKG Interpretation None       Radiology No results found.  Procedures Procedures (including critical care time)  Medications Ordered in UC Medications - No data to display   Initial Impression / Assessment and Plan / UC Course  I have reviewed the triage vital signs and the nursing notes.  Pertinent labs & imaging results that were available during my care of the patient were reviewed by me and considered in my medical decision making (see chart for details).  Suspect viral gastroenteritis. Begin clear liquids (Pedialyte while having diarrhea) until improved, then advance to a [employer] (Bananas, Rice, Applesauce, Toast).  Then gradually resume a regular diet when tolerated.  Avoid milk products until well.  When stools become more formed, may take Imodium (loperamide) once or twice daily to decrease stool frequency.  If symptoms become significantly worse during the night or over the weekend, proceed to the local emergency room. Followup with Family Doctor if not improved in about 5 days.   Final Clinical Impressions(s) / UC Diagnoses   Final diagnoses:  Diarrhea, unspecified type    ED Discharge Orders    None         Kandra Nicolas, MD 01/28/18 1546

## 2018-01-25 NOTE — ED Triage Notes (Signed)
Pt c/o diarrhea, body aches and chills x2 days. Denies N+V.

## 2018-01-25 NOTE — Discharge Instructions (Signed)
Begin clear liquids (Pedialyte while having diarrhea) until improved, then advance to a BRAT diet (Bananas, Rice, Applesauce, Toast).  Then gradually resume a regular diet when tolerated.  Avoid milk products until well.  When stools become more formed, may take Imodium (loperamide) once or twice daily to decrease stool frequency.  °If symptoms become significantly worse during the night or over the weekend, proceed to the local emergency room.  °

## 2018-02-25 ENCOUNTER — Ambulatory Visit (INDEPENDENT_AMBULATORY_CARE_PROVIDER_SITE_OTHER): Payer: BLUE CROSS/BLUE SHIELD | Admitting: Physician Assistant

## 2018-02-25 ENCOUNTER — Encounter: Payer: Self-pay | Admitting: Physician Assistant

## 2018-02-25 VITALS — BP 145/67 | HR 79 | Temp 99.1°F | Ht 62.0 in | Wt 202.0 lb

## 2018-02-25 DIAGNOSIS — J019 Acute sinusitis, unspecified: Secondary | ICD-10-CM

## 2018-02-25 DIAGNOSIS — R0981 Nasal congestion: Secondary | ICD-10-CM

## 2018-02-25 DIAGNOSIS — J029 Acute pharyngitis, unspecified: Secondary | ICD-10-CM | POA: Diagnosis not present

## 2018-02-25 MED ORDER — METHYLPREDNISOLONE SODIUM SUCC 125 MG IJ SOLR
125.0000 mg | Freq: Once | INTRAMUSCULAR | Status: AC
  Administered 2018-02-25: 125 mg via INTRAMUSCULAR

## 2018-02-25 NOTE — Patient Instructions (Signed)

## 2018-02-25 NOTE — Progress Notes (Signed)
Subjective:    Patient ID: Tina Hart, female    DOB: [birth year], 42 y.o.   MRN: [id number]  HPI Tina Hart presents today for a sore throat and congestion that started last night. She states that she had her symptoms start suddenly. She denies any fevers, chills, nausea, vomiting, ear pain, cough. She denies any recent sick contacts. She was outside a couple days doing yard work.  .. Active Ambulatory Problems    Diagnosis Date Noted  . HERPES LABIALIS 06/22/2008  . MIGRAINE HEADACHE 09/29/2010  . Acute upper respiratory infection 12/27/2010  . HYPOTHYROIDISM 09/04/2011  . Postpartum care following cesarean delivery & BTL (11/20) 10/02/2014  . Plantar fasciitis, bilateral 10/13/2015  . Endometriosis 09/18/2016   Resolved Ambulatory Problems    Diagnosis Date Noted  . Acute pharyngitis 12/27/2010  . Cesarean delivery delivered 10/02/2014   Past Medical History:  Diagnosis Date  . Anxiety   . GERD (gastroesophageal reflux disease)   . Headache   . Hyperthyroidism   . PONV (postoperative nausea and vomiting)   . Post partum depression       Review of Systems  Constitutional: Negative for activity change, appetite change, chills and fever.  HENT: Positive for congestion, sinus pressure, sinus pain and sore throat.   Eyes: Negative for pain, discharge and itching.  Respiratory: Negative for cough and shortness of breath.   Gastrointestinal: Positive for nausea and vomiting.       Objective:   Physical Exam  Constitutional: She is oriented to person, place, and time. She appears well-developed and well-nourished. No distress.  HENT:  Head: Normocephalic and atraumatic.  Right Ear: Tympanic membrane and external ear normal.  Left Ear: Tympanic membrane and external ear normal.  Nose: Right sinus exhibits maxillary sinus tenderness and frontal sinus tenderness. Left sinus exhibits maxillary sinus tenderness and frontal sinus tenderness.  Mouth/Throat: No oropharyngeal  exudate, posterior oropharyngeal erythema or tonsillar abscesses.  Eyes: Conjunctivae are normal.  Neck: Normal range of motion. Neck supple.  Cardiovascular: Normal rate, regular rhythm, normal heart sounds and intact distal pulses.  Pulmonary/Chest: Effort normal and breath sounds normal. No respiratory distress. She has no wheezes.  Musculoskeletal: Normal range of motion.  Lymphadenopathy:    She has no cervical adenopathy.  Neurological: She is alert and oriented to person, place, and time.  Skin: Skin is warm and dry.  Psychiatric: She has a normal mood and affect. Her behavior is normal.          Assessment & Plan:  Marland KitchenMarland KitchenDiane was seen today for sore throat, headache and nasal congestion.  Diagnoses and all orders for this visit:  Acute sinusitis, recurrence not specified, unspecified location -     methylPREDNISolone sodium succinate (SOLU-MEDROL) 125 mg/2 mL injection 125 mg  Nasal congestion -     methylPREDNISolone sodium succinate (SOLU-MEDROL) 125 mg/2 mL injection 125 mg  Sore throat -     methylPREDNISolone sodium succinate (SOLU-MEDROL) 125 mg/2 mL injection 125 mg   I do not see any signs of bacterial infection today.  I certainly think it is possible that after mowing the lawn a few days ago she has developed an allergic sinusitis.  Shot of Solu-Medrol 125 mg IM was given in office today.  I certainly think she could add Flonase 2 sprays each nostril once a day.  If symptoms do not improve or if they worsen please call office.  She can certainly continue to use Tylenol and/or ibuprofen or low-grade fever and sore throat.

## 2018-07-23 DIAGNOSIS — D1801 Hemangioma of skin and subcutaneous tissue: Secondary | ICD-10-CM | POA: Diagnosis not present

## 2018-07-23 DIAGNOSIS — L814 Other melanin hyperpigmentation: Secondary | ICD-10-CM | POA: Diagnosis not present

## 2018-07-23 DIAGNOSIS — L821 Other seborrheic keratosis: Secondary | ICD-10-CM | POA: Diagnosis not present

## 2018-07-25 DIAGNOSIS — Z6836 Body mass index (BMI) 36.0-36.9, adult: Secondary | ICD-10-CM | POA: Diagnosis not present

## 2018-07-25 DIAGNOSIS — Z01419 Encounter for gynecological examination (general) (routine) without abnormal findings: Secondary | ICD-10-CM | POA: Diagnosis not present

## 2018-07-25 DIAGNOSIS — E039 Hypothyroidism, unspecified: Secondary | ICD-10-CM | POA: Diagnosis not present

## 2018-07-25 DIAGNOSIS — Z1231 Encounter for screening mammogram for malignant neoplasm of breast: Secondary | ICD-10-CM | POA: Diagnosis not present

## 2018-07-26 ENCOUNTER — Other Ambulatory Visit: Payer: Self-pay | Admitting: Obstetrics and Gynecology

## 2018-07-26 DIAGNOSIS — N6310 Unspecified lump in the right breast, unspecified quadrant: Secondary | ICD-10-CM

## 2018-07-31 ENCOUNTER — Other Ambulatory Visit: Payer: Self-pay | Admitting: Obstetrics and Gynecology

## 2018-07-31 ENCOUNTER — Ambulatory Visit
Admission: RE | Admit: 2018-07-31 | Discharge: 2018-07-31 | Disposition: A | Discharge status: Home / Self Care | Payer: BLUE CROSS/BLUE SHIELD | Source: Ambulatory Visit | Attending: Obstetrics and Gynecology | Admitting: Obstetrics and Gynecology

## 2018-07-31 DIAGNOSIS — R599 Enlarged lymph nodes, unspecified: Secondary | ICD-10-CM

## 2018-07-31 DIAGNOSIS — N6310 Unspecified lump in the right breast, unspecified quadrant: Secondary | ICD-10-CM

## 2018-07-31 DIAGNOSIS — N6489 Other specified disorders of breast: Secondary | ICD-10-CM | POA: Diagnosis not present

## 2018-07-31 DIAGNOSIS — R928 Other abnormal and inconclusive findings on diagnostic imaging of breast: Secondary | ICD-10-CM | POA: Diagnosis not present

## 2018-08-01 ENCOUNTER — Ambulatory Visit
Admission: RE | Admit: 2018-08-01 | Discharge: 2018-08-01 | Disposition: A | Discharge status: Home / Self Care | Payer: BLUE CROSS/BLUE SHIELD | Source: Ambulatory Visit | Attending: Obstetrics and Gynecology | Admitting: Obstetrics and Gynecology

## 2018-08-01 ENCOUNTER — Other Ambulatory Visit: Payer: Self-pay | Admitting: Obstetrics and Gynecology

## 2018-08-01 DIAGNOSIS — R599 Enlarged lymph nodes, unspecified: Secondary | ICD-10-CM

## 2018-08-01 DIAGNOSIS — N6310 Unspecified lump in the right breast, unspecified quadrant: Secondary | ICD-10-CM

## 2018-08-01 DIAGNOSIS — C50411 Malignant neoplasm of upper-outer quadrant of right female breast: Secondary | ICD-10-CM | POA: Diagnosis not present

## 2018-08-01 DIAGNOSIS — N6311 Unspecified lump in the right breast, upper outer quadrant: Secondary | ICD-10-CM | POA: Diagnosis not present

## 2018-08-01 DIAGNOSIS — R590 Localized enlarged lymph nodes: Secondary | ICD-10-CM | POA: Diagnosis not present

## 2018-08-05 ENCOUNTER — Telehealth: Payer: Self-pay | Admitting: Hematology and Oncology

## 2018-08-05 ENCOUNTER — Ambulatory Visit (INDEPENDENT_AMBULATORY_CARE_PROVIDER_SITE_OTHER): Payer: BLUE CROSS/BLUE SHIELD | Admitting: Physician Assistant

## 2018-08-05 ENCOUNTER — Encounter: Payer: Self-pay | Admitting: Physician Assistant

## 2018-08-05 VITALS — BP 133/70 | HR 73 | Temp 99.0°F | Ht 62.0 in | Wt 192.0 lb

## 2018-08-05 DIAGNOSIS — J40 Bronchitis, not specified as acute or chronic: Secondary | ICD-10-CM | POA: Diagnosis not present

## 2018-08-05 DIAGNOSIS — C50911 Malignant neoplasm of unspecified site of right female breast: Secondary | ICD-10-CM | POA: Diagnosis not present

## 2018-08-05 DIAGNOSIS — C773 Secondary and unspecified malignant neoplasm of axilla and upper limb lymph nodes: Secondary | ICD-10-CM | POA: Diagnosis not present

## 2018-08-05 DIAGNOSIS — J329 Chronic sinusitis, unspecified: Secondary | ICD-10-CM | POA: Diagnosis not present

## 2018-08-05 MED ORDER — AZITHROMYCIN 250 MG PO TABS: ORAL_TABLET | ORAL | 0 refills | Status: DC

## 2018-08-05 NOTE — Patient Instructions (Signed)

## 2018-08-05 NOTE — Progress Notes (Deleted)
  Subjective:     Patient ID: Tina Hart, female   DOB: 1976/06/05, 42 y.o.   MRN: [id number]  HPI Patient is a 42 yo female presenting today complaining of a cough. Patient states her illness started as a head cold a week ago, but reports the cough started about 3 days ago. She reports her son and daughter had bronchitis a few weeks ago and were treated for it. She complains of sinus pressure, congestion, and post nasal drip.Review of Systems     Objective:   Physical Exam     Assessment:     ***    Plan:     ***

## 2018-08-05 NOTE — Progress Notes (Signed)
  Subjective:     Patient ID: Tina Hart, female   DOB: 1976/09/03, 42 y.o.   MRN: [id number]  HPI Patient is a 42 yo female presenting today complaining of a cough. Patient states her symptoms started as a head cold a week ago, but reports the cough started about 3 days ago. She reports her son and daughter had bronchitis a few weeks ago and were treated for it. She complains of sinus pressure, congestion, and post nasal drip. She denies any fever or chills. She states she is coughing up mucous but says it is mostly clear. She denies any wheezing or SOB. She does complain of some difficulty sleeping because of the cough and her nose gets congested. She reports some chest pain which she attributes to the coughing. She had a recent breast cancer diagnosis and has an appointment with Oncology on Thursday so would like to have her symptoms cleared up before this appointment.   .. Active Ambulatory Problems    Diagnosis Date Noted  . HERPES LABIALIS 06/22/2008  . MIGRAINE HEADACHE 09/29/2010  . Acute upper respiratory infection 12/27/2010  . HYPOTHYROIDISM 09/04/2011  . Postpartum care following cesarean delivery & BTL (11/20) 10/02/2014  . Plantar fasciitis, bilateral 10/13/2015  . Endometriosis 09/18/2016  . 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 Acute pharyngitis 12/27/2010  . Cesarean delivery delivered 10/02/2014   Past Medical History:  Diagnosis Date  . Anxiety   . GERD (gastroesophageal reflux disease)   . Headache   . Hyperthyroidism   . PONV (postoperative nausea and vomiting)   . Post partum depression      Review of Systems  Constitutional: Negative for chills and fever.  HENT: Positive for congestion, postnasal drip and sinus pressure. Negative for ear pain.   Respiratory: Negative for shortness of breath and wheezing.        Objective:   Physical Exam  Constitutional:  She is oriented to person, place, and time. She appears well-developed and well-nourished.  HENT:  Head: Normocephalic and atraumatic.  Right Ear: Tympanic membrane, external ear and ear canal normal.  Left Ear: Tympanic membrane, external ear and ear canal normal.  Nose: Nose normal.  Mouth/Throat: Oropharynx is clear and moist.  Cardiovascular: Normal rate and regular rhythm.  Pulmonary/Chest: Effort normal. She has no wheezes. She has rhonchi in the right upper field.  Lymphadenopathy:    She has no cervical adenopathy.  Neurological: She is alert and oriented to person, place, and time.       Assessment:     Marland KitchenMarland KitchenDiagnoses and all orders for this visit:  Sinobronchitis -     azithromycin (ZITHROMAX) 250 MG tablet; Take 2 tablets now and then one tablet for 4 days.  Breast cancer metastasized to axillary lymph node, right ([place])       Plan:     Treated with zpak. Discussed symptomatic care. HO given.   Has appt with oncology this week to develop plan for breast cancer.   Marland KitchenVernetta Honey PA-C, have reviewed and agree with the above documentation in it's entirety.

## 2018-08-05 NOTE — Telephone Encounter (Signed)
LVM for patient to return call in reference to morning [facility] appointment for 10/2, packet will be mailed to patient

## 2018-08-08 DIAGNOSIS — C50911 Malignant neoplasm of unspecified site of right female breast: Secondary | ICD-10-CM | POA: Diagnosis not present

## 2018-08-08 DIAGNOSIS — C773 Secondary and unspecified malignant neoplasm of axilla and upper limb lymph nodes: Secondary | ICD-10-CM | POA: Diagnosis not present

## 2018-08-08 DIAGNOSIS — Z90710 Acquired absence of both cervix and uterus: Secondary | ICD-10-CM | POA: Diagnosis not present

## 2018-08-08 DIAGNOSIS — Z9851 Tubal ligation status: Secondary | ICD-10-CM | POA: Diagnosis not present

## 2018-08-08 DIAGNOSIS — Z79899 Other long term (current) drug therapy: Secondary | ICD-10-CM | POA: Diagnosis not present

## 2018-08-08 DIAGNOSIS — C50919 Malignant neoplasm of unspecified site of unspecified female breast: Secondary | ICD-10-CM | POA: Diagnosis not present

## 2018-08-08 DIAGNOSIS — Z803 Family history of malignant neoplasm of breast: Secondary | ICD-10-CM | POA: Diagnosis not present

## 2018-08-08 DIAGNOSIS — Z90721 Acquired absence of ovaries, unilateral: Secondary | ICD-10-CM | POA: Diagnosis not present

## 2018-08-08 DIAGNOSIS — Z5181 Encounter for therapeutic drug level monitoring: Secondary | ICD-10-CM | POA: Diagnosis not present

## 2018-08-08 DIAGNOSIS — Z808 Family history of malignant neoplasm of other organs or systems: Secondary | ICD-10-CM | POA: Diagnosis not present

## 2018-08-08 DIAGNOSIS — Z171 Estrogen receptor negative status [ER-]: Secondary | ICD-10-CM | POA: Diagnosis not present

## 2018-08-09 ENCOUNTER — Encounter: Payer: Self-pay | Admitting: *Deleted

## 2018-08-09 DIAGNOSIS — Z171 Estrogen receptor negative status [ER-]: Secondary | ICD-10-CM

## 2018-08-09 DIAGNOSIS — C50411 Malignant neoplasm of upper-outer quadrant of right female breast: Secondary | ICD-10-CM | POA: Diagnosis not present

## 2018-08-09 DIAGNOSIS — C773 Secondary and unspecified malignant neoplasm of axilla and upper limb lymph nodes: Secondary | ICD-10-CM | POA: Diagnosis not present

## 2018-08-13 ENCOUNTER — Telehealth: Payer: Self-pay | Admitting: *Deleted

## 2018-08-13 ENCOUNTER — Other Ambulatory Visit: Payer: Self-pay

## 2018-08-13 DIAGNOSIS — Z171 Estrogen receptor negative status [ER-]: Principal | ICD-10-CM

## 2018-08-13 DIAGNOSIS — C50411 Malignant neoplasm of upper-outer quadrant of right female breast: Secondary | ICD-10-CM

## 2018-08-13 NOTE — Telephone Encounter (Signed)
Received vm from pt stating she will not be at [facility] on 10.2.19 She will be receiving treatment "closer to home". [facility] notified

## 2018-08-14 ENCOUNTER — Ambulatory Visit: Payer: BLUE CROSS/BLUE SHIELD | Admitting: Radiation Oncology

## 2018-08-14 ENCOUNTER — Other Ambulatory Visit: Payer: BLUE CROSS/BLUE SHIELD

## 2018-08-14 ENCOUNTER — Ambulatory Visit: Payer: BLUE CROSS/BLUE SHIELD | Admitting: Physical Therapy

## 2018-08-14 ENCOUNTER — Ambulatory Visit: Payer: BLUE CROSS/BLUE SHIELD | Admitting: Hematology and Oncology

## 2018-08-15 DIAGNOSIS — Z006 Encounter for examination for normal comparison and control in clinical research program: Secondary | ICD-10-CM | POA: Diagnosis not present

## 2018-08-15 DIAGNOSIS — C50911 Malignant neoplasm of unspecified site of right female breast: Secondary | ICD-10-CM | POA: Diagnosis not present

## 2018-08-15 DIAGNOSIS — C50919 Malignant neoplasm of unspecified site of unspecified female breast: Secondary | ICD-10-CM | POA: Diagnosis not present

## 2018-08-15 DIAGNOSIS — Z171 Estrogen receptor negative status [ER-]: Secondary | ICD-10-CM | POA: Diagnosis not present

## 2018-08-15 DIAGNOSIS — C773 Secondary and unspecified malignant neoplasm of axilla and upper limb lymph nodes: Secondary | ICD-10-CM | POA: Diagnosis not present

## 2018-08-15 DIAGNOSIS — Z79899 Other long term (current) drug therapy: Secondary | ICD-10-CM | POA: Diagnosis not present

## 2018-08-15 DIAGNOSIS — Z5181 Encounter for therapeutic drug level monitoring: Secondary | ICD-10-CM | POA: Diagnosis not present

## 2018-08-15 DIAGNOSIS — C50411 Malignant neoplasm of upper-outer quadrant of right female breast: Secondary | ICD-10-CM | POA: Diagnosis not present

## 2018-08-19 DIAGNOSIS — Z171 Estrogen receptor negative status [ER-]: Secondary | ICD-10-CM | POA: Diagnosis not present

## 2018-08-19 DIAGNOSIS — C773 Secondary and unspecified malignant neoplasm of axilla and upper limb lymph nodes: Secondary | ICD-10-CM | POA: Diagnosis not present

## 2018-08-19 DIAGNOSIS — C50911 Malignant neoplasm of unspecified site of right female breast: Secondary | ICD-10-CM | POA: Diagnosis not present

## 2018-08-19 DIAGNOSIS — Z006 Encounter for examination for normal comparison and control in clinical research program: Secondary | ICD-10-CM | POA: Diagnosis not present

## 2018-08-20 DIAGNOSIS — I499 Cardiac arrhythmia, unspecified: Secondary | ICD-10-CM | POA: Diagnosis not present

## 2018-08-20 DIAGNOSIS — G43909 Migraine, unspecified, not intractable, without status migrainosus: Secondary | ICD-10-CM | POA: Diagnosis not present

## 2018-08-20 DIAGNOSIS — Z171 Estrogen receptor negative status [ER-]: Secondary | ICD-10-CM | POA: Diagnosis not present

## 2018-08-20 DIAGNOSIS — E039 Hypothyroidism, unspecified: Secondary | ICD-10-CM | POA: Diagnosis not present

## 2018-08-20 DIAGNOSIS — Z9889 Other specified postprocedural states: Secondary | ICD-10-CM | POA: Diagnosis not present

## 2018-08-20 DIAGNOSIS — C50911 Malignant neoplasm of unspecified site of right female breast: Secondary | ICD-10-CM | POA: Diagnosis not present

## 2018-08-20 DIAGNOSIS — Z79899 Other long term (current) drug therapy: Secondary | ICD-10-CM | POA: Diagnosis not present

## 2018-08-20 DIAGNOSIS — Z452 Encounter for adjustment and management of vascular access device: Secondary | ICD-10-CM | POA: Diagnosis not present

## 2018-08-20 DIAGNOSIS — C50411 Malignant neoplasm of upper-outer quadrant of right female breast: Secondary | ICD-10-CM | POA: Diagnosis not present

## 2018-08-21 DIAGNOSIS — C50411 Malignant neoplasm of upper-outer quadrant of right female breast: Secondary | ICD-10-CM | POA: Diagnosis not present

## 2018-08-21 DIAGNOSIS — Z171 Estrogen receptor negative status [ER-]: Secondary | ICD-10-CM | POA: Diagnosis not present

## 2018-08-28 DIAGNOSIS — Z006 Encounter for examination for normal comparison and control in clinical research program: Secondary | ICD-10-CM | POA: Diagnosis not present

## 2018-08-28 DIAGNOSIS — Z5111 Encounter for antineoplastic chemotherapy: Secondary | ICD-10-CM | POA: Diagnosis not present

## 2018-08-28 DIAGNOSIS — Z171 Estrogen receptor negative status [ER-]: Secondary | ICD-10-CM | POA: Diagnosis not present

## 2018-08-28 DIAGNOSIS — Z5112 Encounter for antineoplastic immunotherapy: Secondary | ICD-10-CM | POA: Diagnosis not present

## 2018-08-28 DIAGNOSIS — C50411 Malignant neoplasm of upper-outer quadrant of right female breast: Secondary | ICD-10-CM | POA: Diagnosis not present

## 2018-09-04 DIAGNOSIS — R232 Flushing: Secondary | ICD-10-CM | POA: Diagnosis not present

## 2018-09-04 DIAGNOSIS — Z006 Encounter for examination for normal comparison and control in clinical research program: Secondary | ICD-10-CM | POA: Diagnosis not present

## 2018-09-04 DIAGNOSIS — L658 Other specified nonscarring hair loss: Secondary | ICD-10-CM | POA: Diagnosis not present

## 2018-09-04 DIAGNOSIS — T451X5A Adverse effect of antineoplastic and immunosuppressive drugs, initial encounter: Secondary | ICD-10-CM | POA: Diagnosis not present

## 2018-09-04 DIAGNOSIS — C50411 Malignant neoplasm of upper-outer quadrant of right female breast: Secondary | ICD-10-CM | POA: Diagnosis not present

## 2018-09-04 DIAGNOSIS — R000 Tachycardia, unspecified: Secondary | ICD-10-CM | POA: Diagnosis not present

## 2018-09-04 DIAGNOSIS — G5601 Carpal tunnel syndrome, right upper limb: Secondary | ICD-10-CM | POA: Diagnosis not present

## 2018-09-04 DIAGNOSIS — Z79899 Other long term (current) drug therapy: Secondary | ICD-10-CM | POA: Diagnosis not present

## 2018-09-04 DIAGNOSIS — Z171 Estrogen receptor negative status [ER-]: Secondary | ICD-10-CM | POA: Diagnosis not present

## 2018-09-11 DIAGNOSIS — C50411 Malignant neoplasm of upper-outer quadrant of right female breast: Secondary | ICD-10-CM | POA: Diagnosis not present

## 2018-09-11 DIAGNOSIS — Z171 Estrogen receptor negative status [ER-]: Secondary | ICD-10-CM | POA: Diagnosis not present

## 2018-09-12 DIAGNOSIS — C50811 Malignant neoplasm of overlapping sites of right female breast: Secondary | ICD-10-CM | POA: Diagnosis not present

## 2018-09-12 DIAGNOSIS — C50411 Malignant neoplasm of upper-outer quadrant of right female breast: Secondary | ICD-10-CM | POA: Diagnosis not present

## 2018-09-12 DIAGNOSIS — Z006 Encounter for examination for normal comparison and control in clinical research program: Secondary | ICD-10-CM | POA: Diagnosis not present

## 2018-09-12 DIAGNOSIS — Z171 Estrogen receptor negative status [ER-]: Secondary | ICD-10-CM | POA: Diagnosis not present

## 2018-09-12 DIAGNOSIS — Z5111 Encounter for antineoplastic chemotherapy: Secondary | ICD-10-CM | POA: Diagnosis not present

## 2018-09-18 DIAGNOSIS — R590 Localized enlarged lymph nodes: Secondary | ICD-10-CM | POA: Diagnosis not present

## 2018-09-18 DIAGNOSIS — Z006 Encounter for examination for normal comparison and control in clinical research program: Secondary | ICD-10-CM | POA: Diagnosis not present

## 2018-09-18 DIAGNOSIS — Z171 Estrogen receptor negative status [ER-]: Secondary | ICD-10-CM | POA: Diagnosis not present

## 2018-09-18 DIAGNOSIS — C50411 Malignant neoplasm of upper-outer quadrant of right female breast: Secondary | ICD-10-CM | POA: Diagnosis not present

## 2018-09-18 DIAGNOSIS — C50811 Malignant neoplasm of overlapping sites of right female breast: Secondary | ICD-10-CM | POA: Diagnosis not present

## 2018-09-18 DIAGNOSIS — Z79899 Other long term (current) drug therapy: Secondary | ICD-10-CM | POA: Diagnosis not present

## 2018-09-19 DIAGNOSIS — K521 Toxic gastroenteritis and colitis: Secondary | ICD-10-CM | POA: Diagnosis not present

## 2018-09-19 DIAGNOSIS — T451X5A Adverse effect of antineoplastic and immunosuppressive drugs, initial encounter: Secondary | ICD-10-CM | POA: Diagnosis not present

## 2018-09-19 DIAGNOSIS — R197 Diarrhea, unspecified: Secondary | ICD-10-CM | POA: Diagnosis not present

## 2018-09-19 DIAGNOSIS — C50811 Malignant neoplasm of overlapping sites of right female breast: Secondary | ICD-10-CM | POA: Diagnosis not present

## 2018-09-25 DIAGNOSIS — Z006 Encounter for examination for normal comparison and control in clinical research program: Secondary | ICD-10-CM | POA: Diagnosis not present

## 2018-09-25 DIAGNOSIS — Z171 Estrogen receptor negative status [ER-]: Secondary | ICD-10-CM | POA: Diagnosis not present

## 2018-09-25 DIAGNOSIS — Z23 Encounter for immunization: Secondary | ICD-10-CM | POA: Diagnosis not present

## 2018-09-25 DIAGNOSIS — C50411 Malignant neoplasm of upper-outer quadrant of right female breast: Secondary | ICD-10-CM | POA: Diagnosis not present

## 2018-09-25 DIAGNOSIS — Z5111 Encounter for antineoplastic chemotherapy: Secondary | ICD-10-CM | POA: Diagnosis not present

## 2018-10-02 DIAGNOSIS — Z171 Estrogen receptor negative status [ER-]: Secondary | ICD-10-CM | POA: Diagnosis not present

## 2018-10-02 DIAGNOSIS — R600 Localized edema: Secondary | ICD-10-CM | POA: Diagnosis not present

## 2018-10-02 DIAGNOSIS — R2241 Localized swelling, mass and lump, right lower limb: Secondary | ICD-10-CM | POA: Diagnosis not present

## 2018-10-02 DIAGNOSIS — Z006 Encounter for examination for normal comparison and control in clinical research program: Secondary | ICD-10-CM | POA: Diagnosis not present

## 2018-10-02 DIAGNOSIS — Z5111 Encounter for antineoplastic chemotherapy: Secondary | ICD-10-CM | POA: Diagnosis not present

## 2018-10-02 DIAGNOSIS — M25471 Effusion, right ankle: Secondary | ICD-10-CM | POA: Diagnosis not present

## 2018-10-02 DIAGNOSIS — C50411 Malignant neoplasm of upper-outer quadrant of right female breast: Secondary | ICD-10-CM | POA: Diagnosis not present

## 2018-10-08 DIAGNOSIS — L659 Nonscarring hair loss, unspecified: Secondary | ICD-10-CM | POA: Diagnosis not present

## 2018-10-08 DIAGNOSIS — Z171 Estrogen receptor negative status [ER-]: Secondary | ICD-10-CM | POA: Diagnosis not present

## 2018-10-08 DIAGNOSIS — Z006 Encounter for examination for normal comparison and control in clinical research program: Secondary | ICD-10-CM | POA: Diagnosis not present

## 2018-10-08 DIAGNOSIS — R21 Rash and other nonspecific skin eruption: Secondary | ICD-10-CM | POA: Diagnosis not present

## 2018-10-08 DIAGNOSIS — C50411 Malignant neoplasm of upper-outer quadrant of right female breast: Secondary | ICD-10-CM | POA: Diagnosis not present

## 2018-10-08 DIAGNOSIS — Z79899 Other long term (current) drug therapy: Secondary | ICD-10-CM | POA: Diagnosis not present

## 2018-10-09 DIAGNOSIS — L739 Follicular disorder, unspecified: Secondary | ICD-10-CM | POA: Diagnosis not present

## 2018-10-09 DIAGNOSIS — R21 Rash and other nonspecific skin eruption: Secondary | ICD-10-CM | POA: Diagnosis not present

## 2018-10-09 DIAGNOSIS — C50411 Malignant neoplasm of upper-outer quadrant of right female breast: Secondary | ICD-10-CM | POA: Diagnosis not present

## 2018-10-09 DIAGNOSIS — Z79899 Other long term (current) drug therapy: Secondary | ICD-10-CM | POA: Diagnosis not present

## 2018-10-09 DIAGNOSIS — Z006 Encounter for examination for normal comparison and control in clinical research program: Secondary | ICD-10-CM | POA: Diagnosis not present

## 2018-10-09 DIAGNOSIS — Z171 Estrogen receptor negative status [ER-]: Secondary | ICD-10-CM | POA: Diagnosis not present

## 2018-10-11 DIAGNOSIS — C50911 Malignant neoplasm of unspecified site of right female breast: Secondary | ICD-10-CM | POA: Diagnosis not present

## 2018-10-11 DIAGNOSIS — Z171 Estrogen receptor negative status [ER-]: Secondary | ICD-10-CM | POA: Diagnosis not present

## 2018-10-11 DIAGNOSIS — R21 Rash and other nonspecific skin eruption: Secondary | ICD-10-CM | POA: Diagnosis not present

## 2018-10-15 DIAGNOSIS — L739 Follicular disorder, unspecified: Secondary | ICD-10-CM | POA: Diagnosis not present

## 2018-10-16 DIAGNOSIS — Z171 Estrogen receptor negative status [ER-]: Secondary | ICD-10-CM | POA: Diagnosis not present

## 2018-10-16 DIAGNOSIS — Z5111 Encounter for antineoplastic chemotherapy: Secondary | ICD-10-CM | POA: Diagnosis not present

## 2018-10-16 DIAGNOSIS — C50411 Malignant neoplasm of upper-outer quadrant of right female breast: Secondary | ICD-10-CM | POA: Diagnosis not present

## 2018-10-22 DIAGNOSIS — L700 Acne vulgaris: Secondary | ICD-10-CM | POA: Diagnosis not present

## 2018-10-22 DIAGNOSIS — L739 Follicular disorder, unspecified: Secondary | ICD-10-CM | POA: Diagnosis not present

## 2018-10-22 DIAGNOSIS — L0292 Furuncle, unspecified: Secondary | ICD-10-CM | POA: Diagnosis not present

## 2018-10-23 DIAGNOSIS — T451X5A Adverse effect of antineoplastic and immunosuppressive drugs, initial encounter: Secondary | ICD-10-CM | POA: Diagnosis not present

## 2018-10-23 DIAGNOSIS — C50911 Malignant neoplasm of unspecified site of right female breast: Secondary | ICD-10-CM | POA: Diagnosis not present

## 2018-10-23 DIAGNOSIS — R112 Nausea with vomiting, unspecified: Secondary | ICD-10-CM | POA: Diagnosis not present

## 2018-10-23 DIAGNOSIS — Z5111 Encounter for antineoplastic chemotherapy: Secondary | ICD-10-CM | POA: Diagnosis not present

## 2018-10-23 DIAGNOSIS — C50411 Malignant neoplasm of upper-outer quadrant of right female breast: Secondary | ICD-10-CM | POA: Diagnosis not present

## 2018-10-23 DIAGNOSIS — Z171 Estrogen receptor negative status [ER-]: Secondary | ICD-10-CM | POA: Diagnosis not present

## 2018-10-23 DIAGNOSIS — M25571 Pain in right ankle and joints of right foot: Secondary | ICD-10-CM | POA: Diagnosis not present

## 2018-10-23 DIAGNOSIS — Z006 Encounter for examination for normal comparison and control in clinical research program: Secondary | ICD-10-CM | POA: Diagnosis not present

## 2018-10-23 DIAGNOSIS — R911 Solitary pulmonary nodule: Secondary | ICD-10-CM | POA: Diagnosis not present

## 2018-10-23 DIAGNOSIS — D649 Anemia, unspecified: Secondary | ICD-10-CM | POA: Diagnosis not present

## 2018-10-25 DIAGNOSIS — Z171 Estrogen receptor negative status [ER-]: Secondary | ICD-10-CM | POA: Diagnosis not present

## 2018-10-25 DIAGNOSIS — C50411 Malignant neoplasm of upper-outer quadrant of right female breast: Secondary | ICD-10-CM | POA: Diagnosis not present

## 2018-10-25 DIAGNOSIS — R112 Nausea with vomiting, unspecified: Secondary | ICD-10-CM | POA: Diagnosis not present

## 2018-10-25 DIAGNOSIS — Z79899 Other long term (current) drug therapy: Secondary | ICD-10-CM | POA: Diagnosis not present

## 2018-10-25 DIAGNOSIS — R110 Nausea: Secondary | ICD-10-CM | POA: Diagnosis not present

## 2018-10-25 DIAGNOSIS — T451X5A Adverse effect of antineoplastic and immunosuppressive drugs, initial encounter: Secondary | ICD-10-CM | POA: Diagnosis not present

## 2018-10-25 DIAGNOSIS — E876 Hypokalemia: Secondary | ICD-10-CM | POA: Diagnosis not present

## 2018-10-30 DIAGNOSIS — T451X5A Adverse effect of antineoplastic and immunosuppressive drugs, initial encounter: Secondary | ICD-10-CM | POA: Diagnosis not present

## 2018-10-30 DIAGNOSIS — R21 Rash and other nonspecific skin eruption: Secondary | ICD-10-CM | POA: Diagnosis not present

## 2018-10-30 DIAGNOSIS — C50919 Malignant neoplasm of unspecified site of unspecified female breast: Secondary | ICD-10-CM | POA: Diagnosis not present

## 2018-10-30 DIAGNOSIS — C50911 Malignant neoplasm of unspecified site of right female breast: Secondary | ICD-10-CM | POA: Diagnosis not present

## 2018-10-30 DIAGNOSIS — Z171 Estrogen receptor negative status [ER-]: Secondary | ICD-10-CM | POA: Diagnosis not present

## 2018-10-30 DIAGNOSIS — C50411 Malignant neoplasm of upper-outer quadrant of right female breast: Secondary | ICD-10-CM | POA: Diagnosis not present

## 2018-10-30 DIAGNOSIS — L270 Generalized skin eruption due to drugs and medicaments taken internally: Secondary | ICD-10-CM | POA: Diagnosis not present

## 2018-10-30 DIAGNOSIS — R112 Nausea with vomiting, unspecified: Secondary | ICD-10-CM | POA: Diagnosis not present

## 2018-10-30 DIAGNOSIS — Z5111 Encounter for antineoplastic chemotherapy: Secondary | ICD-10-CM | POA: Diagnosis not present

## 2018-10-30 DIAGNOSIS — R911 Solitary pulmonary nodule: Secondary | ICD-10-CM | POA: Diagnosis not present

## 2018-11-05 DIAGNOSIS — Z171 Estrogen receptor negative status [ER-]: Secondary | ICD-10-CM | POA: Diagnosis not present

## 2018-11-05 DIAGNOSIS — Z006 Encounter for examination for normal comparison and control in clinical research program: Secondary | ICD-10-CM | POA: Diagnosis not present

## 2018-11-05 DIAGNOSIS — C50911 Malignant neoplasm of unspecified site of right female breast: Secondary | ICD-10-CM | POA: Diagnosis not present

## 2018-11-05 DIAGNOSIS — Y92538 Other ambulatory health services establishments as the place of occurrence of the external cause: Secondary | ICD-10-CM | POA: Diagnosis not present

## 2018-11-05 DIAGNOSIS — Z90710 Acquired absence of both cervix and uterus: Secondary | ICD-10-CM | POA: Diagnosis not present

## 2018-11-05 DIAGNOSIS — Z79899 Other long term (current) drug therapy: Secondary | ICD-10-CM | POA: Diagnosis not present

## 2018-11-05 DIAGNOSIS — M79646 Pain in unspecified finger(s): Secondary | ICD-10-CM | POA: Diagnosis not present

## 2018-11-05 DIAGNOSIS — R21 Rash and other nonspecific skin eruption: Secondary | ICD-10-CM | POA: Diagnosis not present

## 2018-11-05 DIAGNOSIS — Z5111 Encounter for antineoplastic chemotherapy: Secondary | ICD-10-CM | POA: Diagnosis not present

## 2018-11-05 DIAGNOSIS — R112 Nausea with vomiting, unspecified: Secondary | ICD-10-CM | POA: Diagnosis not present

## 2018-11-05 DIAGNOSIS — C50411 Malignant neoplasm of upper-outer quadrant of right female breast: Secondary | ICD-10-CM | POA: Diagnosis not present

## 2018-11-05 DIAGNOSIS — C773 Secondary and unspecified malignant neoplasm of axilla and upper limb lymph nodes: Secondary | ICD-10-CM | POA: Diagnosis not present

## 2018-11-05 DIAGNOSIS — T451X5A Adverse effect of antineoplastic and immunosuppressive drugs, initial encounter: Secondary | ICD-10-CM | POA: Diagnosis not present

## 2018-11-08 DIAGNOSIS — M25471 Effusion, right ankle: Secondary | ICD-10-CM | POA: Diagnosis not present

## 2018-11-13 HISTORY — PX: OTHER SURGICAL HISTORY: SHX169

## 2018-11-14 DIAGNOSIS — R911 Solitary pulmonary nodule: Secondary | ICD-10-CM | POA: Diagnosis not present

## 2018-11-14 DIAGNOSIS — Z171 Estrogen receptor negative status [ER-]: Secondary | ICD-10-CM | POA: Diagnosis not present

## 2018-11-14 DIAGNOSIS — T451X5A Adverse effect of antineoplastic and immunosuppressive drugs, initial encounter: Secondary | ICD-10-CM | POA: Diagnosis not present

## 2018-11-14 DIAGNOSIS — Z5111 Encounter for antineoplastic chemotherapy: Secondary | ICD-10-CM | POA: Diagnosis not present

## 2018-11-14 DIAGNOSIS — C50411 Malignant neoplasm of upper-outer quadrant of right female breast: Secondary | ICD-10-CM | POA: Diagnosis not present

## 2018-11-14 DIAGNOSIS — R112 Nausea with vomiting, unspecified: Secondary | ICD-10-CM | POA: Diagnosis not present

## 2018-11-14 DIAGNOSIS — Z006 Encounter for examination for normal comparison and control in clinical research program: Secondary | ICD-10-CM | POA: Diagnosis not present

## 2018-11-14 DIAGNOSIS — C50919 Malignant neoplasm of unspecified site of unspecified female breast: Secondary | ICD-10-CM | POA: Diagnosis not present

## 2018-11-19 DIAGNOSIS — Z006 Encounter for examination for normal comparison and control in clinical research program: Secondary | ICD-10-CM | POA: Diagnosis not present

## 2018-11-19 DIAGNOSIS — C50411 Malignant neoplasm of upper-outer quadrant of right female breast: Secondary | ICD-10-CM | POA: Diagnosis not present

## 2018-11-19 DIAGNOSIS — Z171 Estrogen receptor negative status [ER-]: Secondary | ICD-10-CM | POA: Diagnosis not present

## 2018-11-20 DIAGNOSIS — Z171 Estrogen receptor negative status [ER-]: Secondary | ICD-10-CM | POA: Diagnosis not present

## 2018-11-20 DIAGNOSIS — C50811 Malignant neoplasm of overlapping sites of right female breast: Secondary | ICD-10-CM | POA: Diagnosis not present

## 2018-11-20 DIAGNOSIS — Z803 Family history of malignant neoplasm of breast: Secondary | ICD-10-CM | POA: Diagnosis not present

## 2018-11-20 DIAGNOSIS — Z79899 Other long term (current) drug therapy: Secondary | ICD-10-CM | POA: Diagnosis not present

## 2018-11-22 DIAGNOSIS — L270 Generalized skin eruption due to drugs and medicaments taken internally: Secondary | ICD-10-CM | POA: Diagnosis not present

## 2018-11-27 DIAGNOSIS — R112 Nausea with vomiting, unspecified: Secondary | ICD-10-CM | POA: Diagnosis not present

## 2018-11-27 DIAGNOSIS — Z5111 Encounter for antineoplastic chemotherapy: Secondary | ICD-10-CM | POA: Diagnosis not present

## 2018-11-27 DIAGNOSIS — Z171 Estrogen receptor negative status [ER-]: Secondary | ICD-10-CM | POA: Diagnosis not present

## 2018-11-27 DIAGNOSIS — C50911 Malignant neoplasm of unspecified site of right female breast: Secondary | ICD-10-CM | POA: Diagnosis not present

## 2018-11-27 DIAGNOSIS — T451X5D Adverse effect of antineoplastic and immunosuppressive drugs, subsequent encounter: Secondary | ICD-10-CM | POA: Diagnosis not present

## 2018-11-27 DIAGNOSIS — C50411 Malignant neoplasm of upper-outer quadrant of right female breast: Secondary | ICD-10-CM | POA: Diagnosis not present

## 2018-12-03 ENCOUNTER — Other Ambulatory Visit: Payer: Self-pay

## 2018-12-03 ENCOUNTER — Encounter: Payer: Self-pay | Admitting: Emergency Medicine

## 2018-12-03 ENCOUNTER — Emergency Department
Admission: EM | Admit: 2018-12-03 | Discharge: 2018-12-03 | Disposition: A | Discharge status: Home / Self Care | Payer: BLUE CROSS/BLUE SHIELD | Source: Home / Self Care | Attending: Family Medicine | Admitting: Family Medicine

## 2018-12-03 DIAGNOSIS — J029 Acute pharyngitis, unspecified: Secondary | ICD-10-CM | POA: Diagnosis not present

## 2018-12-03 HISTORY — DX: Malignant (primary) neoplasm, unspecified: C80.1

## 2018-12-03 LAB — POCT RAPID STREP A (OFFICE): Rapid Strep A Screen: NEGATIVE

## 2018-12-03 MED ORDER — MAGIC MOUTHWASH W/LIDOCAINE: 5.0000 mL | Freq: Three times a day (TID) | ORAL | 0 refills | Status: DC | PRN

## 2018-12-03 NOTE — ED Triage Notes (Signed)
Patient has had a sore throat for about 2 days; low grade fever. She completed chemotherapy last week and when she called her MD about the throat he asked her to come for a strep test vs.thrush dx.

## 2018-12-03 NOTE — ED Provider Notes (Signed)
[facility]    CSN: [id number] Arrival date & time: 12/03/18  1542     History   Chief Complaint Chief Complaint  Patient presents with  . Sore Throat    HPI Tina Hart is a 43 y.o. female.   HPI  Tina Hart is a 43 y.o. female presenting to UC with c/o sore throat that started 2 days ago with a low grade fever. She completed a new course of chemotherapy last week.  She called her oncologist about the sort throat.  Pt was advised to come to urgent care to be tested for strep throat and evaluated for possible thrush. No prior hx of thrush. Denies fever today. Denies cough, congestion, HA, n/v/d. Her mother was dx with bronchitis and strep throat recently.    Past Medical History:  Diagnosis Date  . Anxiety   . Cancer ([place])   . GERD (gastroesophageal reflux disease)    WITH PREGNANCY  . Headache   . Hyperthyroidism   . PONV (postoperative nausea and vomiting)   . Post partum depression     Patient Active Problem List   Diagnosis Date Noted  . Malignant neoplasm of upper-outer quadrant of right breast in female, estrogen receptor negative ([place]) 08/09/2018  . 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 Endometriosis 09/18/2016  . Plantar fasciitis, bilateral 10/13/2015  . Postpartum care following cesarean delivery & BTL (11/20) 10/02/2014  . HYPOTHYROIDISM 09/04/2011  . Acute upper respiratory infection 12/27/2010  . MIGRAINE HEADACHE 09/29/2010  . HERPES LABIALIS 06/22/2008    Past Surgical History:  Procedure Laterality Date  . CARPAL TUNNEL RELEASE Right 06/2016  . CESAREAN SECTION    . CESAREAN SECTION WITH BILATERAL TUBAL LIGATION Bilateral 10/02/2014   Procedure: Repeat CESAREAN SECTION WITH BILATERAL TUBAL LIGATION;  Surgeon: Lovenia Kim, MD;  Location: [facility];  Service: Obstetrics;  Laterality: Bilateral;  EDD: 10/09/14  . COLONOSCOPY    . ENDOMETRIAL ABLATION W/ NOVASURE  03/2015  . OOPHORECTOMY  Left 09/18/2016   Procedure: OOPHORECTOMY;  Surgeon: Brien Few, MD;  Location: [facility];  Service: Gynecology;  Laterality: Left;  . ROBOTIC ASSISTED TOTAL HYSTERECTOMY WITH SALPINGECTOMY Bilateral 09/18/2016   Procedure: ROBOTIC ASSISTED TOTAL HYSTERECTOMY WITH SALPINGECTOMY;  Surgeon: Brien Few, MD;  Location: [facility];  Service: Gynecology;  Laterality: Bilateral;  . WISDOM TOOTH EXTRACTION      OB History    Gravida  2   Para  2   Term  2   Preterm  0   AB  0   Living  1     SAB  0   TAB  0   Ectopic  0   Multiple  0   Live Births  1            Home Medications    Prior to Admission medications   Medication Sig Start Date End Date Taking? Authorizing Provider  azithromycin (ZITHROMAX) 250 MG tablet Take 2 tablets now and then one tablet for 4 days. 08/05/18   Breeback, Royetta Car, PA-C  levothyroxine (SYNTHROID, LEVOTHROID) 75 MCG tablet Take 75 mcg by mouth daily before breakfast.     [provider]  magic mouthwash w/lidocaine SOLN Take 5-10 mLs by mouth 3 (three) times daily as needed for mouth pain (for sore throat). Swish, gargle for up to 60 seconds. Spit. May swallow for sore throat. 12/03/18   Noe Gens, PA-C  sertraline (ZOLOFT) 25 MG tablet Take 3  tablets (75 mg total) by mouth daily. 10/05/14   Artelia Laroche, CNM  valACYclovir (VALTREX) 500 MG tablet Take 500 mg by mouth 2 (two) times daily as needed (for cold sores).    [provider]  ZOLMitriptan (ZOMIG) 2.5 MG tablet Take 2.5 mg by mouth every 2 (two) hours as needed for migraine.     [provider]    Family History Family History  Problem Relation Age of Onset  . Breast cancer Mother 56  . Pancreatic cancer Mother 62    Social History Social History   Tobacco Use  . Smoking status: Never Smoker  . Smokeless tobacco: Never Used  Substance Use Topics  . Alcohol use: Yes    Comment: social not during pregnancy  . Drug use: No     Allergies     Erythromycin and Sulfonamide derivatives   Review of Systems Review of Systems  Constitutional: Negative for chills and fever.  HENT: Positive for sore throat. Negative for congestion, ear pain, trouble swallowing and voice change.   Respiratory: Negative for cough and shortness of breath.   Cardiovascular: Negative for chest pain and palpitations.  Gastrointestinal: Negative for abdominal pain, diarrhea, nausea and vomiting.  Musculoskeletal: Negative for arthralgias, back pain and myalgias.  Skin: Negative for rash.     Physical Exam Triage Vital Signs ED Triage Vitals  Enc Vitals Group     BP 12/03/18 1606 103/71     Pulse Rate 12/03/18 1606 80     Resp 12/03/18 1606 18     Temp 12/03/18 1606 98.1 F (36.7 C)     Temp Source 12/03/18 1606 Oral     SpO2 12/03/18 1606 98 %     Weight 12/03/18 1607 190 lb (86.2 kg)     Height 12/03/18 1607 5\' 2"  (1.575 m)     Head Circumference --      Peak Flow --      Pain Score 12/03/18 1607 2     Pain Loc --      Pain Edu? --      Excl. in [place]? --    No data found.  Updated Vital Signs BP 103/71 (BP Location: Right Arm)   Pulse 80   Temp 98.1 F (36.7 C) (Oral)   Resp 18   Ht 5\' 2"  (1.575 m)   Wt 190 lb (86.2 kg)   LMP 06/13/2016   SpO2 98%   BMI 34.75 kg/m   Visual Acuity Right Eye Distance:   Left Eye Distance:   Bilateral Distance:    Right Eye Near:   Left Eye Near:    Bilateral Near:     Physical Exam Vitals signs and nursing note reviewed.  Constitutional:      Appearance: She is well-developed.  HENT:     Head: Normocephalic and atraumatic.     Right Ear: Tympanic membrane normal.     Left Ear: Tympanic membrane normal.     Nose: Nose normal.     Right Sinus: No maxillary sinus tenderness or frontal sinus tenderness.     Left Sinus: No maxillary sinus tenderness or frontal sinus tenderness.     Mouth/Throat:     Lips: Pink.     Mouth: Mucous membranes are moist. No oral lesions.     Tongue: No  lesions.     Pharynx: Oropharynx is clear. Uvula midline. Posterior oropharyngeal erythema ( mild) present. No pharyngeal swelling, oropharyngeal exudate or uvula swelling.  Neck:  Musculoskeletal: Normal range of motion and neck supple.  Cardiovascular:     Rate and Rhythm: Normal rate and regular rhythm.  Pulmonary:     Effort: Pulmonary effort is normal. No respiratory distress.     Breath sounds: Normal breath sounds. No stridor. No wheezing or rhonchi.  Musculoskeletal: Normal range of motion.  Lymphadenopathy:     Cervical: No cervical adenopathy.  Skin:    General: Skin is warm and dry.  Neurological:     Mental Status: She is alert and oriented to person, place, and time.  Psychiatric:        Behavior: Behavior normal.      UC Treatments / Results  Labs (all labs ordered are listed, but only abnormal results are displayed) Labs Reviewed  STREP A DNA PROBE  POCT RAPID STREP A (OFFICE)    EKG None  Radiology No results found.  Procedures Procedures (including critical care time)  Medications Ordered in UC Medications - No data to display  Initial Impression / Assessment and Plan / UC Course  I have reviewed the triage vital signs and the nursing notes.  Pertinent labs & imaging results that were available during my care of the patient were reviewed by me and considered in my medical decision making (see chart for details).     Rapid strep: NEGATIVE Culture sent Exam not c/w oral thrush. Encouraged symptomatic tx and f/u with PCP or oncologist.  Final Clinical Impressions(s) / UC Diagnoses   Final diagnoses:  Sore throat     Discharge Instructions      You will be notified in about 24-48 hours of the results of the strep culture. If positive, an antibiotic will be called in. You should be notified either way.  Please follow up with family medicine or your oncologist if not improving in 1 week. Sooner if worsening.     ED Prescriptions     Medication Sig Dispense Auth. Provider   magic mouthwash w/lidocaine SOLN Take 5-10 mLs by mouth 3 (three) times daily as needed for mouth pain (for sore throat). Swish, gargle for up to 60 seconds. Spit. May swallow for sore throat. 60 mL Noe Gens, PA-C     Controlled Substance Prescriptions [place] Controlled Substance Registry consulted? Not Applicable   Tyrell Antonio 12/03/18 1730

## 2018-12-03 NOTE — Discharge Instructions (Signed)
°  You will be notified in about 24-48 hours of the results of the strep culture. If positive, an antibiotic will be called in. You should be notified either way.  Please follow up with family medicine or your oncologist if not improving in 1 week. Sooner if worsening.

## 2018-12-04 ENCOUNTER — Telehealth: Payer: Self-pay | Admitting: Emergency Medicine

## 2018-12-04 LAB — STREP A DNA PROBE: Group A Strep Probe: NOT DETECTED

## 2018-12-10 DIAGNOSIS — Z171 Estrogen receptor negative status [ER-]: Secondary | ICD-10-CM | POA: Diagnosis not present

## 2018-12-10 DIAGNOSIS — C50811 Malignant neoplasm of overlapping sites of right female breast: Secondary | ICD-10-CM | POA: Diagnosis not present

## 2018-12-11 DIAGNOSIS — R112 Nausea with vomiting, unspecified: Secondary | ICD-10-CM | POA: Diagnosis not present

## 2018-12-11 DIAGNOSIS — L658 Other specified nonscarring hair loss: Secondary | ICD-10-CM | POA: Diagnosis not present

## 2018-12-11 DIAGNOSIS — C50911 Malignant neoplasm of unspecified site of right female breast: Secondary | ICD-10-CM | POA: Diagnosis not present

## 2018-12-11 DIAGNOSIS — Z79899 Other long term (current) drug therapy: Secondary | ICD-10-CM | POA: Diagnosis not present

## 2018-12-11 DIAGNOSIS — T451X5A Adverse effect of antineoplastic and immunosuppressive drugs, initial encounter: Secondary | ICD-10-CM | POA: Diagnosis not present

## 2018-12-11 DIAGNOSIS — Z5111 Encounter for antineoplastic chemotherapy: Secondary | ICD-10-CM | POA: Diagnosis not present

## 2018-12-11 DIAGNOSIS — C50411 Malignant neoplasm of upper-outer quadrant of right female breast: Secondary | ICD-10-CM | POA: Diagnosis not present

## 2018-12-11 DIAGNOSIS — Z171 Estrogen receptor negative status [ER-]: Secondary | ICD-10-CM | POA: Diagnosis not present

## 2018-12-12 DIAGNOSIS — Z79899 Other long term (current) drug therapy: Secondary | ICD-10-CM | POA: Diagnosis not present

## 2018-12-12 DIAGNOSIS — Z171 Estrogen receptor negative status [ER-]: Secondary | ICD-10-CM | POA: Diagnosis not present

## 2018-12-12 DIAGNOSIS — R112 Nausea with vomiting, unspecified: Secondary | ICD-10-CM | POA: Diagnosis not present

## 2018-12-12 DIAGNOSIS — C50411 Malignant neoplasm of upper-outer quadrant of right female breast: Secondary | ICD-10-CM | POA: Diagnosis not present

## 2018-12-13 DIAGNOSIS — Z171 Estrogen receptor negative status [ER-]: Secondary | ICD-10-CM | POA: Diagnosis not present

## 2018-12-13 DIAGNOSIS — Z79899 Other long term (current) drug therapy: Secondary | ICD-10-CM | POA: Diagnosis not present

## 2018-12-13 DIAGNOSIS — C50411 Malignant neoplasm of upper-outer quadrant of right female breast: Secondary | ICD-10-CM | POA: Diagnosis not present

## 2018-12-13 DIAGNOSIS — R112 Nausea with vomiting, unspecified: Secondary | ICD-10-CM | POA: Diagnosis not present

## 2018-12-14 DIAGNOSIS — C50411 Malignant neoplasm of upper-outer quadrant of right female breast: Secondary | ICD-10-CM | POA: Diagnosis not present

## 2018-12-14 DIAGNOSIS — R112 Nausea with vomiting, unspecified: Secondary | ICD-10-CM | POA: Diagnosis not present

## 2018-12-14 DIAGNOSIS — Z79899 Other long term (current) drug therapy: Secondary | ICD-10-CM | POA: Diagnosis not present

## 2018-12-14 DIAGNOSIS — Z171 Estrogen receptor negative status [ER-]: Secondary | ICD-10-CM | POA: Diagnosis not present

## 2018-12-18 DIAGNOSIS — Z171 Estrogen receptor negative status [ER-]: Secondary | ICD-10-CM | POA: Diagnosis not present

## 2018-12-18 DIAGNOSIS — C50911 Malignant neoplasm of unspecified site of right female breast: Secondary | ICD-10-CM | POA: Diagnosis not present

## 2018-12-23 DIAGNOSIS — C50411 Malignant neoplasm of upper-outer quadrant of right female breast: Secondary | ICD-10-CM | POA: Diagnosis not present

## 2018-12-23 DIAGNOSIS — Z171 Estrogen receptor negative status [ER-]: Secondary | ICD-10-CM | POA: Diagnosis not present

## 2018-12-25 DIAGNOSIS — R112 Nausea with vomiting, unspecified: Secondary | ICD-10-CM | POA: Diagnosis not present

## 2018-12-25 DIAGNOSIS — Z171 Estrogen receptor negative status [ER-]: Secondary | ICD-10-CM | POA: Diagnosis not present

## 2018-12-25 DIAGNOSIS — C50411 Malignant neoplasm of upper-outer quadrant of right female breast: Secondary | ICD-10-CM | POA: Diagnosis not present

## 2018-12-25 DIAGNOSIS — X58XXXA Exposure to other specified factors, initial encounter: Secondary | ICD-10-CM | POA: Diagnosis not present

## 2018-12-25 DIAGNOSIS — Z79899 Other long term (current) drug therapy: Secondary | ICD-10-CM | POA: Diagnosis not present

## 2018-12-25 DIAGNOSIS — C50811 Malignant neoplasm of overlapping sites of right female breast: Secondary | ICD-10-CM | POA: Diagnosis not present

## 2018-12-25 DIAGNOSIS — T451X5A Adverse effect of antineoplastic and immunosuppressive drugs, initial encounter: Secondary | ICD-10-CM | POA: Diagnosis not present

## 2018-12-26 DIAGNOSIS — C50811 Malignant neoplasm of overlapping sites of right female breast: Secondary | ICD-10-CM | POA: Diagnosis not present

## 2018-12-26 DIAGNOSIS — R110 Nausea: Secondary | ICD-10-CM | POA: Diagnosis not present

## 2018-12-26 DIAGNOSIS — Z171 Estrogen receptor negative status [ER-]: Secondary | ICD-10-CM | POA: Diagnosis not present

## 2018-12-27 DIAGNOSIS — C50811 Malignant neoplasm of overlapping sites of right female breast: Secondary | ICD-10-CM | POA: Diagnosis not present

## 2018-12-27 DIAGNOSIS — R110 Nausea: Secondary | ICD-10-CM | POA: Diagnosis not present

## 2018-12-27 DIAGNOSIS — Z171 Estrogen receptor negative status [ER-]: Secondary | ICD-10-CM | POA: Diagnosis not present

## 2018-12-28 DIAGNOSIS — Z171 Estrogen receptor negative status [ER-]: Secondary | ICD-10-CM | POA: Diagnosis not present

## 2018-12-28 DIAGNOSIS — R110 Nausea: Secondary | ICD-10-CM | POA: Diagnosis not present

## 2018-12-28 DIAGNOSIS — C50811 Malignant neoplasm of overlapping sites of right female breast: Secondary | ICD-10-CM | POA: Diagnosis not present

## 2018-12-31 DIAGNOSIS — R590 Localized enlarged lymph nodes: Secondary | ICD-10-CM | POA: Diagnosis not present

## 2018-12-31 DIAGNOSIS — I313 Pericardial effusion (noninflammatory): Secondary | ICD-10-CM | POA: Diagnosis not present

## 2019-01-06 ENCOUNTER — Ambulatory Visit (INDEPENDENT_AMBULATORY_CARE_PROVIDER_SITE_OTHER): Payer: BLUE CROSS/BLUE SHIELD | Admitting: Physician Assistant

## 2019-01-06 ENCOUNTER — Encounter: Payer: Self-pay | Admitting: Physician Assistant

## 2019-01-06 VITALS — BP 125/73 | HR 70 | Temp 98.2°F | Ht 62.0 in | Wt 195.0 lb

## 2019-01-06 DIAGNOSIS — J0100 Acute maxillary sinusitis, unspecified: Secondary | ICD-10-CM

## 2019-01-06 DIAGNOSIS — R05 Cough: Secondary | ICD-10-CM

## 2019-01-06 DIAGNOSIS — R059 Cough, unspecified: Secondary | ICD-10-CM

## 2019-01-06 MED ORDER — AMOXICILLIN-POT CLAVULANATE 875-125 MG PO TABS: 1.0000 | ORAL_TABLET | Freq: Two times a day (BID) | ORAL | 0 refills | Status: DC

## 2019-01-06 MED ORDER — HYDROCOD POLST-CPM POLST ER 10-8 MG/5ML PO SUER: 5.0000 mL | Freq: Two times a day (BID) | ORAL | 0 refills | Status: DC | PRN

## 2019-01-06 NOTE — Progress Notes (Signed)
   Subjective:    Patient ID: Tina Hart, female    DOB: [birth year], 43 y.o.   MRN: [id number]  HPI Patient is a 43 yo female with right breast cancer who  presents today with a cough that she has had for 9 days. She is now coughing up mucus that she describes as  thick and yellow. The cough is keeping her up at night and causing her some pleuritic chest pain. She also endorses sinus pressure, headache, congestion and rhinorrhea She denies any fever, shortness of breath, nausea, vomiting or diarrhea.She has tried tylenol severe, mucinex and sudafed with minimal relief.  She denies any recent sick contacts. She has an upcoming chemotherapy treatment on Wednesday and is hoping to feel better by then.    .. Active Ambulatory Problems    Diagnosis Date Noted  . HERPES LABIALIS 06/22/2008  . MIGRAINE HEADACHE 09/29/2010  . Acute upper respiratory infection 12/27/2010  . HYPOTHYROIDISM 09/04/2011  . Postpartum care following cesarean delivery & BTL (11/20) 10/02/2014  . Plantar fasciitis, bilateral 10/13/2015  . Endometriosis 09/18/2016  . 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 Malignant neoplasm of upper-outer quadrant of right breast in female, estrogen receptor negative ([place]) 08/09/2018   Resolved Ambulatory Problems    Diagnosis Date Noted  . Acute pharyngitis 12/27/2010  . Cesarean delivery delivered 10/02/2014   Past Medical History:  Diagnosis Date  . Anxiety   . Cancer ([place])   . GERD (gastroesophageal reflux disease)   . Headache   . Hyperthyroidism   . PONV (postoperative nausea and vomiting)   . Post partum depression       Review of Systems See HPI    Objective:   Physical Exam Constitutional:      Appearance: Normal appearance.  HENT:     Head: Normocephalic.     Right Ear: Tympanic membrane normal.     Left Ear: Tympanic membrane normal.     Nose: Congestion present.     Mouth/Throat:     Mouth: Mucous membranes are  moist.     Pharynx: Posterior oropharyngeal erythema present. No oropharyngeal exudate.  Eyes:     Conjunctiva/sclera: Conjunctivae normal.  Cardiovascular:     Rate and Rhythm: Normal rate and regular rhythm.  Pulmonary:     Breath sounds: Normal breath sounds. No wheezing or rhonchi.  Skin:    General: Skin is warm.     Capillary Refill: Capillary refill takes less than 2 seconds.  Neurological:     General: No focal deficit present.     Mental Status: She is alert.  Psychiatric:        Mood and Affect: Mood normal.           Assessment & Plan:  Marland KitchenMarland KitchenDiane was seen today for cough and nasal congestion.  Diagnoses and all orders for this visit:  Acute non-recurrent maxillary sinusitis -     amoxicillin-clavulanate (AUGMENTIN) 875-125 MG tablet; Take 1 tablet by mouth 2 (two) times daily.  Cough -     chlorpheniramine-HYDROcodone (TUSSIONEX) 10-8 MG/5ML SUER; Take 5 mLs by mouth every 12 (twelve) hours as needed.  pt treated for sinusitis with augmentin. She has flonase at home she will start. Rest and hydrate. tussionex given for cough at bedtime. Discussed sedation risk. HO given.   Marland KitchenMarland KitchenPDMP reviewed during this encounter.  Marland KitchenVernetta Honey PA-C, have reviewed and agree with the above documentation in it's entirety.

## 2019-01-06 NOTE — Patient Instructions (Signed)

## 2019-01-08 DIAGNOSIS — X58XXXA Exposure to other specified factors, initial encounter: Secondary | ICD-10-CM | POA: Diagnosis not present

## 2019-01-08 DIAGNOSIS — R112 Nausea with vomiting, unspecified: Secondary | ICD-10-CM | POA: Diagnosis not present

## 2019-01-08 DIAGNOSIS — C50411 Malignant neoplasm of upper-outer quadrant of right female breast: Secondary | ICD-10-CM | POA: Diagnosis not present

## 2019-01-08 DIAGNOSIS — Z171 Estrogen receptor negative status [ER-]: Secondary | ICD-10-CM | POA: Diagnosis not present

## 2019-01-08 DIAGNOSIS — C50911 Malignant neoplasm of unspecified site of right female breast: Secondary | ICD-10-CM | POA: Diagnosis not present

## 2019-01-08 DIAGNOSIS — T451X5A Adverse effect of antineoplastic and immunosuppressive drugs, initial encounter: Secondary | ICD-10-CM | POA: Diagnosis not present

## 2019-01-09 DIAGNOSIS — R110 Nausea: Secondary | ICD-10-CM | POA: Diagnosis not present

## 2019-01-09 DIAGNOSIS — C50411 Malignant neoplasm of upper-outer quadrant of right female breast: Secondary | ICD-10-CM | POA: Diagnosis not present

## 2019-01-09 DIAGNOSIS — Z79899 Other long term (current) drug therapy: Secondary | ICD-10-CM | POA: Diagnosis not present

## 2019-01-09 DIAGNOSIS — T451X5A Adverse effect of antineoplastic and immunosuppressive drugs, initial encounter: Secondary | ICD-10-CM | POA: Diagnosis not present

## 2019-01-09 DIAGNOSIS — X58XXXA Exposure to other specified factors, initial encounter: Secondary | ICD-10-CM | POA: Diagnosis not present

## 2019-01-09 DIAGNOSIS — Z171 Estrogen receptor negative status [ER-]: Secondary | ICD-10-CM | POA: Diagnosis not present

## 2019-01-10 DIAGNOSIS — Z79899 Other long term (current) drug therapy: Secondary | ICD-10-CM | POA: Diagnosis not present

## 2019-01-10 DIAGNOSIS — C50411 Malignant neoplasm of upper-outer quadrant of right female breast: Secondary | ICD-10-CM | POA: Diagnosis not present

## 2019-01-10 DIAGNOSIS — Z171 Estrogen receptor negative status [ER-]: Secondary | ICD-10-CM | POA: Diagnosis not present

## 2019-01-11 DIAGNOSIS — R110 Nausea: Secondary | ICD-10-CM | POA: Diagnosis not present

## 2019-01-11 DIAGNOSIS — Z79899 Other long term (current) drug therapy: Secondary | ICD-10-CM | POA: Diagnosis not present

## 2019-01-11 DIAGNOSIS — Z171 Estrogen receptor negative status [ER-]: Secondary | ICD-10-CM | POA: Diagnosis not present

## 2019-01-11 DIAGNOSIS — C50411 Malignant neoplasm of upper-outer quadrant of right female breast: Secondary | ICD-10-CM | POA: Diagnosis not present

## 2019-01-15 DIAGNOSIS — Z171 Estrogen receptor negative status [ER-]: Secondary | ICD-10-CM | POA: Diagnosis not present

## 2019-01-15 DIAGNOSIS — C50811 Malignant neoplasm of overlapping sites of right female breast: Secondary | ICD-10-CM | POA: Diagnosis not present

## 2019-01-22 DIAGNOSIS — C50411 Malignant neoplasm of upper-outer quadrant of right female breast: Secondary | ICD-10-CM | POA: Diagnosis not present

## 2019-01-22 DIAGNOSIS — C50911 Malignant neoplasm of unspecified site of right female breast: Secondary | ICD-10-CM | POA: Diagnosis not present

## 2019-01-22 DIAGNOSIS — Z9221 Personal history of antineoplastic chemotherapy: Secondary | ICD-10-CM | POA: Diagnosis not present

## 2019-01-22 DIAGNOSIS — Z006 Encounter for examination for normal comparison and control in clinical research program: Secondary | ICD-10-CM | POA: Diagnosis not present

## 2019-01-22 DIAGNOSIS — Z171 Estrogen receptor negative status [ER-]: Secondary | ICD-10-CM | POA: Diagnosis not present

## 2019-01-24 DIAGNOSIS — C773 Secondary and unspecified malignant neoplasm of axilla and upper limb lymph nodes: Secondary | ICD-10-CM | POA: Diagnosis not present

## 2019-01-24 DIAGNOSIS — Z171 Estrogen receptor negative status [ER-]: Secondary | ICD-10-CM | POA: Diagnosis not present

## 2019-01-24 DIAGNOSIS — C50811 Malignant neoplasm of overlapping sites of right female breast: Secondary | ICD-10-CM | POA: Diagnosis not present

## 2019-01-24 DIAGNOSIS — C50911 Malignant neoplasm of unspecified site of right female breast: Secondary | ICD-10-CM | POA: Diagnosis not present

## 2019-01-24 DIAGNOSIS — I313 Pericardial effusion (noninflammatory): Secondary | ICD-10-CM | POA: Diagnosis not present

## 2019-01-24 DIAGNOSIS — R911 Solitary pulmonary nodule: Secondary | ICD-10-CM | POA: Diagnosis not present

## 2019-02-05 DIAGNOSIS — C50911 Malignant neoplasm of unspecified site of right female breast: Secondary | ICD-10-CM | POA: Diagnosis not present

## 2019-02-05 DIAGNOSIS — C50411 Malignant neoplasm of upper-outer quadrant of right female breast: Secondary | ICD-10-CM | POA: Diagnosis not present

## 2019-02-05 DIAGNOSIS — R590 Localized enlarged lymph nodes: Secondary | ICD-10-CM | POA: Diagnosis not present

## 2019-02-05 DIAGNOSIS — C50811 Malignant neoplasm of overlapping sites of right female breast: Secondary | ICD-10-CM | POA: Diagnosis not present

## 2019-02-05 DIAGNOSIS — Z171 Estrogen receptor negative status [ER-]: Secondary | ICD-10-CM | POA: Diagnosis not present

## 2019-02-10 DIAGNOSIS — D241 Benign neoplasm of right breast: Secondary | ICD-10-CM | POA: Diagnosis not present

## 2019-02-10 DIAGNOSIS — E039 Hypothyroidism, unspecified: Secondary | ICD-10-CM | POA: Diagnosis not present

## 2019-02-10 DIAGNOSIS — C50411 Malignant neoplasm of upper-outer quadrant of right female breast: Secondary | ICD-10-CM | POA: Diagnosis not present

## 2019-02-10 DIAGNOSIS — Z171 Estrogen receptor negative status [ER-]: Secondary | ICD-10-CM | POA: Diagnosis not present

## 2019-02-10 DIAGNOSIS — C773 Secondary and unspecified malignant neoplasm of axilla and upper limb lymph nodes: Secondary | ICD-10-CM | POA: Diagnosis not present

## 2019-02-10 DIAGNOSIS — C50811 Malignant neoplasm of overlapping sites of right female breast: Secondary | ICD-10-CM | POA: Diagnosis not present

## 2019-02-10 DIAGNOSIS — C50911 Malignant neoplasm of unspecified site of right female breast: Secondary | ICD-10-CM | POA: Diagnosis not present

## 2019-02-10 DIAGNOSIS — G8918 Other acute postprocedural pain: Secondary | ICD-10-CM | POA: Diagnosis not present

## 2019-02-10 DIAGNOSIS — Z79899 Other long term (current) drug therapy: Secondary | ICD-10-CM | POA: Diagnosis not present

## 2019-02-11 DIAGNOSIS — E039 Hypothyroidism, unspecified: Secondary | ICD-10-CM | POA: Diagnosis not present

## 2019-02-11 DIAGNOSIS — C50911 Malignant neoplasm of unspecified site of right female breast: Secondary | ICD-10-CM | POA: Diagnosis not present

## 2019-02-11 DIAGNOSIS — Z79899 Other long term (current) drug therapy: Secondary | ICD-10-CM | POA: Diagnosis not present

## 2019-02-11 DIAGNOSIS — Z171 Estrogen receptor negative status [ER-]: Secondary | ICD-10-CM | POA: Diagnosis not present

## 2019-02-11 DIAGNOSIS — C773 Secondary and unspecified malignant neoplasm of axilla and upper limb lymph nodes: Secondary | ICD-10-CM | POA: Diagnosis not present

## 2019-02-11 DIAGNOSIS — Z9012 Acquired absence of left breast and nipple: Secondary | ICD-10-CM | POA: Diagnosis not present

## 2019-02-11 DIAGNOSIS — C50811 Malignant neoplasm of overlapping sites of right female breast: Secondary | ICD-10-CM | POA: Diagnosis not present

## 2019-03-03 DIAGNOSIS — G8918 Other acute postprocedural pain: Secondary | ICD-10-CM | POA: Diagnosis not present

## 2019-03-03 DIAGNOSIS — I890 Lymphedema, not elsewhere classified: Secondary | ICD-10-CM | POA: Diagnosis not present

## 2019-03-03 DIAGNOSIS — Z9010 Acquired absence of unspecified breast and nipple: Secondary | ICD-10-CM | POA: Diagnosis not present

## 2019-03-03 DIAGNOSIS — M25611 Stiffness of right shoulder, not elsewhere classified: Secondary | ICD-10-CM | POA: Diagnosis not present

## 2019-03-03 DIAGNOSIS — M25612 Stiffness of left shoulder, not elsewhere classified: Secondary | ICD-10-CM | POA: Diagnosis not present

## 2019-03-04 DIAGNOSIS — C50411 Malignant neoplasm of upper-outer quadrant of right female breast: Secondary | ICD-10-CM | POA: Diagnosis not present

## 2019-03-04 DIAGNOSIS — Z171 Estrogen receptor negative status [ER-]: Secondary | ICD-10-CM | POA: Diagnosis not present

## 2019-03-05 DIAGNOSIS — C50411 Malignant neoplasm of upper-outer quadrant of right female breast: Secondary | ICD-10-CM | POA: Diagnosis not present

## 2019-03-05 DIAGNOSIS — Z171 Estrogen receptor negative status [ER-]: Secondary | ICD-10-CM | POA: Diagnosis not present

## 2019-03-19 DIAGNOSIS — G8918 Other acute postprocedural pain: Secondary | ICD-10-CM | POA: Diagnosis not present

## 2019-03-19 DIAGNOSIS — M25611 Stiffness of right shoulder, not elsewhere classified: Secondary | ICD-10-CM | POA: Diagnosis not present

## 2019-03-19 DIAGNOSIS — Z9010 Acquired absence of unspecified breast and nipple: Secondary | ICD-10-CM | POA: Diagnosis not present

## 2019-03-19 DIAGNOSIS — I890 Lymphedema, not elsewhere classified: Secondary | ICD-10-CM | POA: Diagnosis not present

## 2019-03-19 DIAGNOSIS — M25612 Stiffness of left shoulder, not elsewhere classified: Secondary | ICD-10-CM | POA: Diagnosis not present

## 2019-03-24 DIAGNOSIS — Z9010 Acquired absence of unspecified breast and nipple: Secondary | ICD-10-CM | POA: Diagnosis not present

## 2019-03-24 DIAGNOSIS — M25612 Stiffness of left shoulder, not elsewhere classified: Secondary | ICD-10-CM | POA: Diagnosis not present

## 2019-03-24 DIAGNOSIS — M25611 Stiffness of right shoulder, not elsewhere classified: Secondary | ICD-10-CM | POA: Diagnosis not present

## 2019-03-24 DIAGNOSIS — G8918 Other acute postprocedural pain: Secondary | ICD-10-CM | POA: Diagnosis not present

## 2019-03-24 DIAGNOSIS — I890 Lymphedema, not elsewhere classified: Secondary | ICD-10-CM | POA: Diagnosis not present

## 2019-03-25 DIAGNOSIS — C773 Secondary and unspecified malignant neoplasm of axilla and upper limb lymph nodes: Secondary | ICD-10-CM | POA: Diagnosis not present

## 2019-03-25 DIAGNOSIS — C50911 Malignant neoplasm of unspecified site of right female breast: Secondary | ICD-10-CM | POA: Diagnosis not present

## 2019-03-25 DIAGNOSIS — C50411 Malignant neoplasm of upper-outer quadrant of right female breast: Secondary | ICD-10-CM | POA: Diagnosis not present

## 2019-03-25 DIAGNOSIS — Z510 Encounter for antineoplastic radiation therapy: Secondary | ICD-10-CM | POA: Diagnosis not present

## 2019-03-25 DIAGNOSIS — Z171 Estrogen receptor negative status [ER-]: Secondary | ICD-10-CM | POA: Diagnosis not present

## 2019-03-25 DIAGNOSIS — Z9013 Acquired absence of bilateral breasts and nipples: Secondary | ICD-10-CM | POA: Diagnosis not present

## 2019-03-26 DIAGNOSIS — C50919 Malignant neoplasm of unspecified site of unspecified female breast: Secondary | ICD-10-CM | POA: Diagnosis not present

## 2019-03-26 DIAGNOSIS — R911 Solitary pulmonary nodule: Secondary | ICD-10-CM | POA: Diagnosis not present

## 2019-03-26 DIAGNOSIS — Z171 Estrogen receptor negative status [ER-]: Secondary | ICD-10-CM | POA: Diagnosis not present

## 2019-03-27 DIAGNOSIS — C773 Secondary and unspecified malignant neoplasm of axilla and upper limb lymph nodes: Secondary | ICD-10-CM | POA: Diagnosis not present

## 2019-03-27 DIAGNOSIS — C50411 Malignant neoplasm of upper-outer quadrant of right female breast: Secondary | ICD-10-CM | POA: Diagnosis not present

## 2019-03-27 DIAGNOSIS — Z171 Estrogen receptor negative status [ER-]: Secondary | ICD-10-CM | POA: Diagnosis not present

## 2019-03-27 DIAGNOSIS — Z9013 Acquired absence of bilateral breasts and nipples: Secondary | ICD-10-CM | POA: Diagnosis not present

## 2019-03-27 DIAGNOSIS — Z510 Encounter for antineoplastic radiation therapy: Secondary | ICD-10-CM | POA: Diagnosis not present

## 2019-03-27 DIAGNOSIS — C50911 Malignant neoplasm of unspecified site of right female breast: Secondary | ICD-10-CM | POA: Diagnosis not present

## 2019-04-01 DIAGNOSIS — R911 Solitary pulmonary nodule: Secondary | ICD-10-CM | POA: Diagnosis not present

## 2019-04-01 DIAGNOSIS — Z171 Estrogen receptor negative status [ER-]: Secondary | ICD-10-CM | POA: Diagnosis not present

## 2019-04-01 DIAGNOSIS — C50411 Malignant neoplasm of upper-outer quadrant of right female breast: Secondary | ICD-10-CM | POA: Diagnosis not present

## 2019-04-02 DIAGNOSIS — M25612 Stiffness of left shoulder, not elsewhere classified: Secondary | ICD-10-CM | POA: Diagnosis not present

## 2019-04-02 DIAGNOSIS — G8918 Other acute postprocedural pain: Secondary | ICD-10-CM | POA: Diagnosis not present

## 2019-04-02 DIAGNOSIS — M25611 Stiffness of right shoulder, not elsewhere classified: Secondary | ICD-10-CM | POA: Diagnosis not present

## 2019-04-02 DIAGNOSIS — I890 Lymphedema, not elsewhere classified: Secondary | ICD-10-CM | POA: Diagnosis not present

## 2019-04-02 DIAGNOSIS — Z9010 Acquired absence of unspecified breast and nipple: Secondary | ICD-10-CM | POA: Diagnosis not present

## 2019-04-04 DIAGNOSIS — C773 Secondary and unspecified malignant neoplasm of axilla and upper limb lymph nodes: Secondary | ICD-10-CM | POA: Diagnosis not present

## 2019-04-04 DIAGNOSIS — C50411 Malignant neoplasm of upper-outer quadrant of right female breast: Secondary | ICD-10-CM | POA: Diagnosis not present

## 2019-04-04 DIAGNOSIS — Z9013 Acquired absence of bilateral breasts and nipples: Secondary | ICD-10-CM | POA: Diagnosis not present

## 2019-04-04 DIAGNOSIS — Z171 Estrogen receptor negative status [ER-]: Secondary | ICD-10-CM | POA: Diagnosis not present

## 2019-04-04 DIAGNOSIS — Z510 Encounter for antineoplastic radiation therapy: Secondary | ICD-10-CM | POA: Diagnosis not present

## 2019-04-04 DIAGNOSIS — C50911 Malignant neoplasm of unspecified site of right female breast: Secondary | ICD-10-CM | POA: Diagnosis not present

## 2019-04-08 DIAGNOSIS — Z171 Estrogen receptor negative status [ER-]: Secondary | ICD-10-CM | POA: Diagnosis not present

## 2019-04-08 DIAGNOSIS — C773 Secondary and unspecified malignant neoplasm of axilla and upper limb lymph nodes: Secondary | ICD-10-CM | POA: Diagnosis not present

## 2019-04-08 DIAGNOSIS — Z510 Encounter for antineoplastic radiation therapy: Secondary | ICD-10-CM | POA: Diagnosis not present

## 2019-04-08 DIAGNOSIS — C50411 Malignant neoplasm of upper-outer quadrant of right female breast: Secondary | ICD-10-CM | POA: Diagnosis not present

## 2019-04-08 DIAGNOSIS — Z9013 Acquired absence of bilateral breasts and nipples: Secondary | ICD-10-CM | POA: Diagnosis not present

## 2019-04-08 DIAGNOSIS — C50911 Malignant neoplasm of unspecified site of right female breast: Secondary | ICD-10-CM | POA: Diagnosis not present

## 2019-04-08 DIAGNOSIS — C50919 Malignant neoplasm of unspecified site of unspecified female breast: Secondary | ICD-10-CM | POA: Diagnosis not present

## 2019-04-09 DIAGNOSIS — Z510 Encounter for antineoplastic radiation therapy: Secondary | ICD-10-CM | POA: Diagnosis not present

## 2019-04-09 DIAGNOSIS — C773 Secondary and unspecified malignant neoplasm of axilla and upper limb lymph nodes: Secondary | ICD-10-CM | POA: Diagnosis not present

## 2019-04-09 DIAGNOSIS — Z171 Estrogen receptor negative status [ER-]: Secondary | ICD-10-CM | POA: Diagnosis not present

## 2019-04-09 DIAGNOSIS — C50411 Malignant neoplasm of upper-outer quadrant of right female breast: Secondary | ICD-10-CM | POA: Diagnosis not present

## 2019-04-09 DIAGNOSIS — Z9013 Acquired absence of bilateral breasts and nipples: Secondary | ICD-10-CM | POA: Diagnosis not present

## 2019-04-10 DIAGNOSIS — C50411 Malignant neoplasm of upper-outer quadrant of right female breast: Secondary | ICD-10-CM | POA: Diagnosis not present

## 2019-04-10 DIAGNOSIS — Z510 Encounter for antineoplastic radiation therapy: Secondary | ICD-10-CM | POA: Diagnosis not present

## 2019-04-10 DIAGNOSIS — Z9013 Acquired absence of bilateral breasts and nipples: Secondary | ICD-10-CM | POA: Diagnosis not present

## 2019-04-10 DIAGNOSIS — Z171 Estrogen receptor negative status [ER-]: Secondary | ICD-10-CM | POA: Diagnosis not present

## 2019-04-10 DIAGNOSIS — C773 Secondary and unspecified malignant neoplasm of axilla and upper limb lymph nodes: Secondary | ICD-10-CM | POA: Diagnosis not present

## 2019-04-11 DIAGNOSIS — Z510 Encounter for antineoplastic radiation therapy: Secondary | ICD-10-CM | POA: Diagnosis not present

## 2019-04-11 DIAGNOSIS — Z171 Estrogen receptor negative status [ER-]: Secondary | ICD-10-CM | POA: Diagnosis not present

## 2019-04-11 DIAGNOSIS — C773 Secondary and unspecified malignant neoplasm of axilla and upper limb lymph nodes: Secondary | ICD-10-CM | POA: Diagnosis not present

## 2019-04-11 DIAGNOSIS — Z9013 Acquired absence of bilateral breasts and nipples: Secondary | ICD-10-CM | POA: Diagnosis not present

## 2019-04-11 DIAGNOSIS — C50411 Malignant neoplasm of upper-outer quadrant of right female breast: Secondary | ICD-10-CM | POA: Diagnosis not present

## 2019-04-14 DIAGNOSIS — Z171 Estrogen receptor negative status [ER-]: Secondary | ICD-10-CM | POA: Diagnosis not present

## 2019-04-14 DIAGNOSIS — Z510 Encounter for antineoplastic radiation therapy: Secondary | ICD-10-CM | POA: Diagnosis not present

## 2019-04-14 DIAGNOSIS — Z9013 Acquired absence of bilateral breasts and nipples: Secondary | ICD-10-CM | POA: Diagnosis not present

## 2019-04-14 DIAGNOSIS — C773 Secondary and unspecified malignant neoplasm of axilla and upper limb lymph nodes: Secondary | ICD-10-CM | POA: Diagnosis not present

## 2019-04-14 DIAGNOSIS — C50411 Malignant neoplasm of upper-outer quadrant of right female breast: Secondary | ICD-10-CM | POA: Diagnosis not present

## 2019-04-15 DIAGNOSIS — M25612 Stiffness of left shoulder, not elsewhere classified: Secondary | ICD-10-CM | POA: Diagnosis not present

## 2019-04-15 DIAGNOSIS — Z171 Estrogen receptor negative status [ER-]: Secondary | ICD-10-CM | POA: Diagnosis not present

## 2019-04-15 DIAGNOSIS — G8918 Other acute postprocedural pain: Secondary | ICD-10-CM | POA: Diagnosis not present

## 2019-04-15 DIAGNOSIS — I890 Lymphedema, not elsewhere classified: Secondary | ICD-10-CM | POA: Diagnosis not present

## 2019-04-15 DIAGNOSIS — Z510 Encounter for antineoplastic radiation therapy: Secondary | ICD-10-CM | POA: Diagnosis not present

## 2019-04-15 DIAGNOSIS — Z9013 Acquired absence of bilateral breasts and nipples: Secondary | ICD-10-CM | POA: Diagnosis not present

## 2019-04-15 DIAGNOSIS — M25611 Stiffness of right shoulder, not elsewhere classified: Secondary | ICD-10-CM | POA: Diagnosis not present

## 2019-04-15 DIAGNOSIS — Z9010 Acquired absence of unspecified breast and nipple: Secondary | ICD-10-CM | POA: Diagnosis not present

## 2019-04-15 DIAGNOSIS — C50911 Malignant neoplasm of unspecified site of right female breast: Secondary | ICD-10-CM | POA: Diagnosis not present

## 2019-04-15 DIAGNOSIS — C773 Secondary and unspecified malignant neoplasm of axilla and upper limb lymph nodes: Secondary | ICD-10-CM | POA: Diagnosis not present

## 2019-04-15 DIAGNOSIS — C50411 Malignant neoplasm of upper-outer quadrant of right female breast: Secondary | ICD-10-CM | POA: Diagnosis not present

## 2019-04-15 DIAGNOSIS — Z9221 Personal history of antineoplastic chemotherapy: Secondary | ICD-10-CM | POA: Diagnosis not present

## 2019-04-16 DIAGNOSIS — C773 Secondary and unspecified malignant neoplasm of axilla and upper limb lymph nodes: Secondary | ICD-10-CM | POA: Diagnosis not present

## 2019-04-16 DIAGNOSIS — C50411 Malignant neoplasm of upper-outer quadrant of right female breast: Secondary | ICD-10-CM | POA: Diagnosis not present

## 2019-04-16 DIAGNOSIS — Z510 Encounter for antineoplastic radiation therapy: Secondary | ICD-10-CM | POA: Diagnosis not present

## 2019-04-16 DIAGNOSIS — Z171 Estrogen receptor negative status [ER-]: Secondary | ICD-10-CM | POA: Diagnosis not present

## 2019-04-16 DIAGNOSIS — Z9013 Acquired absence of bilateral breasts and nipples: Secondary | ICD-10-CM | POA: Diagnosis not present

## 2019-04-17 DIAGNOSIS — Z9221 Personal history of antineoplastic chemotherapy: Secondary | ICD-10-CM | POA: Diagnosis not present

## 2019-04-17 DIAGNOSIS — Z510 Encounter for antineoplastic radiation therapy: Secondary | ICD-10-CM | POA: Diagnosis not present

## 2019-04-17 DIAGNOSIS — Z9013 Acquired absence of bilateral breasts and nipples: Secondary | ICD-10-CM | POA: Diagnosis not present

## 2019-04-17 DIAGNOSIS — C50411 Malignant neoplasm of upper-outer quadrant of right female breast: Secondary | ICD-10-CM | POA: Diagnosis not present

## 2019-04-17 DIAGNOSIS — Z9011 Acquired absence of right breast and nipple: Secondary | ICD-10-CM | POA: Diagnosis not present

## 2019-04-17 DIAGNOSIS — I972 Postmastectomy lymphedema syndrome: Secondary | ICD-10-CM | POA: Diagnosis not present

## 2019-04-17 DIAGNOSIS — C773 Secondary and unspecified malignant neoplasm of axilla and upper limb lymph nodes: Secondary | ICD-10-CM | POA: Diagnosis not present

## 2019-04-17 DIAGNOSIS — Z9012 Acquired absence of left breast and nipple: Secondary | ICD-10-CM | POA: Diagnosis not present

## 2019-04-17 DIAGNOSIS — Z171 Estrogen receptor negative status [ER-]: Secondary | ICD-10-CM | POA: Diagnosis not present

## 2019-04-18 DIAGNOSIS — Z9011 Acquired absence of right breast and nipple: Secondary | ICD-10-CM | POA: Diagnosis not present

## 2019-04-18 DIAGNOSIS — Z171 Estrogen receptor negative status [ER-]: Secondary | ICD-10-CM | POA: Diagnosis not present

## 2019-04-18 DIAGNOSIS — I972 Postmastectomy lymphedema syndrome: Secondary | ICD-10-CM | POA: Diagnosis not present

## 2019-04-18 DIAGNOSIS — Z9012 Acquired absence of left breast and nipple: Secondary | ICD-10-CM | POA: Diagnosis not present

## 2019-04-18 DIAGNOSIS — C773 Secondary and unspecified malignant neoplasm of axilla and upper limb lymph nodes: Secondary | ICD-10-CM | POA: Diagnosis not present

## 2019-04-18 DIAGNOSIS — Z9221 Personal history of antineoplastic chemotherapy: Secondary | ICD-10-CM | POA: Diagnosis not present

## 2019-04-18 DIAGNOSIS — C50411 Malignant neoplasm of upper-outer quadrant of right female breast: Secondary | ICD-10-CM | POA: Diagnosis not present

## 2019-04-18 DIAGNOSIS — Z510 Encounter for antineoplastic radiation therapy: Secondary | ICD-10-CM | POA: Diagnosis not present

## 2019-04-18 DIAGNOSIS — Z9013 Acquired absence of bilateral breasts and nipples: Secondary | ICD-10-CM | POA: Diagnosis not present

## 2019-04-21 DIAGNOSIS — C50411 Malignant neoplasm of upper-outer quadrant of right female breast: Secondary | ICD-10-CM | POA: Diagnosis not present

## 2019-04-21 DIAGNOSIS — C773 Secondary and unspecified malignant neoplasm of axilla and upper limb lymph nodes: Secondary | ICD-10-CM | POA: Diagnosis not present

## 2019-04-21 DIAGNOSIS — Z9013 Acquired absence of bilateral breasts and nipples: Secondary | ICD-10-CM | POA: Diagnosis not present

## 2019-04-21 DIAGNOSIS — Z510 Encounter for antineoplastic radiation therapy: Secondary | ICD-10-CM | POA: Diagnosis not present

## 2019-04-21 DIAGNOSIS — Z171 Estrogen receptor negative status [ER-]: Secondary | ICD-10-CM | POA: Diagnosis not present

## 2019-04-21 DIAGNOSIS — Z9221 Personal history of antineoplastic chemotherapy: Secondary | ICD-10-CM | POA: Diagnosis not present

## 2019-04-22 DIAGNOSIS — Z9013 Acquired absence of bilateral breasts and nipples: Secondary | ICD-10-CM | POA: Diagnosis not present

## 2019-04-22 DIAGNOSIS — C50911 Malignant neoplasm of unspecified site of right female breast: Secondary | ICD-10-CM | POA: Diagnosis not present

## 2019-04-22 DIAGNOSIS — C773 Secondary and unspecified malignant neoplasm of axilla and upper limb lymph nodes: Secondary | ICD-10-CM | POA: Diagnosis not present

## 2019-04-22 DIAGNOSIS — Z9221 Personal history of antineoplastic chemotherapy: Secondary | ICD-10-CM | POA: Diagnosis not present

## 2019-04-22 DIAGNOSIS — Z510 Encounter for antineoplastic radiation therapy: Secondary | ICD-10-CM | POA: Diagnosis not present

## 2019-04-22 DIAGNOSIS — Z171 Estrogen receptor negative status [ER-]: Secondary | ICD-10-CM | POA: Diagnosis not present

## 2019-04-22 DIAGNOSIS — C50411 Malignant neoplasm of upper-outer quadrant of right female breast: Secondary | ICD-10-CM | POA: Diagnosis not present

## 2019-04-23 DIAGNOSIS — Z9221 Personal history of antineoplastic chemotherapy: Secondary | ICD-10-CM | POA: Diagnosis not present

## 2019-04-23 DIAGNOSIS — Z510 Encounter for antineoplastic radiation therapy: Secondary | ICD-10-CM | POA: Diagnosis not present

## 2019-04-23 DIAGNOSIS — C773 Secondary and unspecified malignant neoplasm of axilla and upper limb lymph nodes: Secondary | ICD-10-CM | POA: Diagnosis not present

## 2019-04-23 DIAGNOSIS — Z9013 Acquired absence of bilateral breasts and nipples: Secondary | ICD-10-CM | POA: Diagnosis not present

## 2019-04-23 DIAGNOSIS — C50411 Malignant neoplasm of upper-outer quadrant of right female breast: Secondary | ICD-10-CM | POA: Diagnosis not present

## 2019-04-23 DIAGNOSIS — Z171 Estrogen receptor negative status [ER-]: Secondary | ICD-10-CM | POA: Diagnosis not present

## 2019-04-24 DIAGNOSIS — Z171 Estrogen receptor negative status [ER-]: Secondary | ICD-10-CM | POA: Diagnosis not present

## 2019-04-24 DIAGNOSIS — Z510 Encounter for antineoplastic radiation therapy: Secondary | ICD-10-CM | POA: Diagnosis not present

## 2019-04-24 DIAGNOSIS — C773 Secondary and unspecified malignant neoplasm of axilla and upper limb lymph nodes: Secondary | ICD-10-CM | POA: Diagnosis not present

## 2019-04-24 DIAGNOSIS — Z9013 Acquired absence of bilateral breasts and nipples: Secondary | ICD-10-CM | POA: Diagnosis not present

## 2019-04-24 DIAGNOSIS — C50411 Malignant neoplasm of upper-outer quadrant of right female breast: Secondary | ICD-10-CM | POA: Diagnosis not present

## 2019-04-24 DIAGNOSIS — Z9221 Personal history of antineoplastic chemotherapy: Secondary | ICD-10-CM | POA: Diagnosis not present

## 2019-04-25 DIAGNOSIS — Z9221 Personal history of antineoplastic chemotherapy: Secondary | ICD-10-CM | POA: Diagnosis not present

## 2019-04-25 DIAGNOSIS — Z510 Encounter for antineoplastic radiation therapy: Secondary | ICD-10-CM | POA: Diagnosis not present

## 2019-04-25 DIAGNOSIS — C773 Secondary and unspecified malignant neoplasm of axilla and upper limb lymph nodes: Secondary | ICD-10-CM | POA: Diagnosis not present

## 2019-04-25 DIAGNOSIS — Z9013 Acquired absence of bilateral breasts and nipples: Secondary | ICD-10-CM | POA: Diagnosis not present

## 2019-04-25 DIAGNOSIS — Z171 Estrogen receptor negative status [ER-]: Secondary | ICD-10-CM | POA: Diagnosis not present

## 2019-04-25 DIAGNOSIS — C50411 Malignant neoplasm of upper-outer quadrant of right female breast: Secondary | ICD-10-CM | POA: Diagnosis not present

## 2019-04-28 DIAGNOSIS — C50411 Malignant neoplasm of upper-outer quadrant of right female breast: Secondary | ICD-10-CM | POA: Diagnosis not present

## 2019-04-28 DIAGNOSIS — Z9221 Personal history of antineoplastic chemotherapy: Secondary | ICD-10-CM | POA: Diagnosis not present

## 2019-04-28 DIAGNOSIS — Z171 Estrogen receptor negative status [ER-]: Secondary | ICD-10-CM | POA: Diagnosis not present

## 2019-04-28 DIAGNOSIS — Z9013 Acquired absence of bilateral breasts and nipples: Secondary | ICD-10-CM | POA: Diagnosis not present

## 2019-04-28 DIAGNOSIS — C773 Secondary and unspecified malignant neoplasm of axilla and upper limb lymph nodes: Secondary | ICD-10-CM | POA: Diagnosis not present

## 2019-04-28 DIAGNOSIS — Z510 Encounter for antineoplastic radiation therapy: Secondary | ICD-10-CM | POA: Diagnosis not present

## 2019-04-29 DIAGNOSIS — Z510 Encounter for antineoplastic radiation therapy: Secondary | ICD-10-CM | POA: Diagnosis not present

## 2019-04-29 DIAGNOSIS — Z9013 Acquired absence of bilateral breasts and nipples: Secondary | ICD-10-CM | POA: Diagnosis not present

## 2019-04-29 DIAGNOSIS — Z171 Estrogen receptor negative status [ER-]: Secondary | ICD-10-CM | POA: Diagnosis not present

## 2019-04-29 DIAGNOSIS — C50411 Malignant neoplasm of upper-outer quadrant of right female breast: Secondary | ICD-10-CM | POA: Diagnosis not present

## 2019-04-29 DIAGNOSIS — C773 Secondary and unspecified malignant neoplasm of axilla and upper limb lymph nodes: Secondary | ICD-10-CM | POA: Diagnosis not present

## 2019-04-29 DIAGNOSIS — Z9221 Personal history of antineoplastic chemotherapy: Secondary | ICD-10-CM | POA: Diagnosis not present

## 2019-04-30 DIAGNOSIS — C50911 Malignant neoplasm of unspecified site of right female breast: Secondary | ICD-10-CM | POA: Diagnosis not present

## 2019-04-30 DIAGNOSIS — Z9221 Personal history of antineoplastic chemotherapy: Secondary | ICD-10-CM | POA: Diagnosis not present

## 2019-04-30 DIAGNOSIS — C773 Secondary and unspecified malignant neoplasm of axilla and upper limb lymph nodes: Secondary | ICD-10-CM | POA: Diagnosis not present

## 2019-04-30 DIAGNOSIS — Z171 Estrogen receptor negative status [ER-]: Secondary | ICD-10-CM | POA: Diagnosis not present

## 2019-04-30 DIAGNOSIS — Z9013 Acquired absence of bilateral breasts and nipples: Secondary | ICD-10-CM | POA: Diagnosis not present

## 2019-04-30 DIAGNOSIS — C50411 Malignant neoplasm of upper-outer quadrant of right female breast: Secondary | ICD-10-CM | POA: Diagnosis not present

## 2019-04-30 DIAGNOSIS — Z510 Encounter for antineoplastic radiation therapy: Secondary | ICD-10-CM | POA: Diagnosis not present

## 2019-05-01 DIAGNOSIS — Z9013 Acquired absence of bilateral breasts and nipples: Secondary | ICD-10-CM | POA: Diagnosis not present

## 2019-05-01 DIAGNOSIS — Z510 Encounter for antineoplastic radiation therapy: Secondary | ICD-10-CM | POA: Diagnosis not present

## 2019-05-01 DIAGNOSIS — Z9221 Personal history of antineoplastic chemotherapy: Secondary | ICD-10-CM | POA: Diagnosis not present

## 2019-05-01 DIAGNOSIS — Z171 Estrogen receptor negative status [ER-]: Secondary | ICD-10-CM | POA: Diagnosis not present

## 2019-05-01 DIAGNOSIS — C773 Secondary and unspecified malignant neoplasm of axilla and upper limb lymph nodes: Secondary | ICD-10-CM | POA: Diagnosis not present

## 2019-05-01 DIAGNOSIS — C50411 Malignant neoplasm of upper-outer quadrant of right female breast: Secondary | ICD-10-CM | POA: Diagnosis not present

## 2019-05-02 DIAGNOSIS — Z171 Estrogen receptor negative status [ER-]: Secondary | ICD-10-CM | POA: Diagnosis not present

## 2019-05-02 DIAGNOSIS — Z9013 Acquired absence of bilateral breasts and nipples: Secondary | ICD-10-CM | POA: Diagnosis not present

## 2019-05-02 DIAGNOSIS — C50411 Malignant neoplasm of upper-outer quadrant of right female breast: Secondary | ICD-10-CM | POA: Diagnosis not present

## 2019-05-02 DIAGNOSIS — Z9221 Personal history of antineoplastic chemotherapy: Secondary | ICD-10-CM | POA: Diagnosis not present

## 2019-05-02 DIAGNOSIS — Z510 Encounter for antineoplastic radiation therapy: Secondary | ICD-10-CM | POA: Diagnosis not present

## 2019-05-02 DIAGNOSIS — C773 Secondary and unspecified malignant neoplasm of axilla and upper limb lymph nodes: Secondary | ICD-10-CM | POA: Diagnosis not present

## 2019-05-05 DIAGNOSIS — Z510 Encounter for antineoplastic radiation therapy: Secondary | ICD-10-CM | POA: Diagnosis not present

## 2019-05-05 DIAGNOSIS — C773 Secondary and unspecified malignant neoplasm of axilla and upper limb lymph nodes: Secondary | ICD-10-CM | POA: Diagnosis not present

## 2019-05-05 DIAGNOSIS — Z9013 Acquired absence of bilateral breasts and nipples: Secondary | ICD-10-CM | POA: Diagnosis not present

## 2019-05-05 DIAGNOSIS — Z9221 Personal history of antineoplastic chemotherapy: Secondary | ICD-10-CM | POA: Diagnosis not present

## 2019-05-05 DIAGNOSIS — Z171 Estrogen receptor negative status [ER-]: Secondary | ICD-10-CM | POA: Diagnosis not present

## 2019-05-05 DIAGNOSIS — C50411 Malignant neoplasm of upper-outer quadrant of right female breast: Secondary | ICD-10-CM | POA: Diagnosis not present

## 2019-05-06 DIAGNOSIS — C50911 Malignant neoplasm of unspecified site of right female breast: Secondary | ICD-10-CM | POA: Diagnosis not present

## 2019-05-06 DIAGNOSIS — C50411 Malignant neoplasm of upper-outer quadrant of right female breast: Secondary | ICD-10-CM | POA: Diagnosis not present

## 2019-05-06 DIAGNOSIS — Z9221 Personal history of antineoplastic chemotherapy: Secondary | ICD-10-CM | POA: Diagnosis not present

## 2019-05-06 DIAGNOSIS — Z171 Estrogen receptor negative status [ER-]: Secondary | ICD-10-CM | POA: Diagnosis not present

## 2019-05-06 DIAGNOSIS — Z9013 Acquired absence of bilateral breasts and nipples: Secondary | ICD-10-CM | POA: Diagnosis not present

## 2019-05-06 DIAGNOSIS — Z510 Encounter for antineoplastic radiation therapy: Secondary | ICD-10-CM | POA: Diagnosis not present

## 2019-05-06 DIAGNOSIS — C773 Secondary and unspecified malignant neoplasm of axilla and upper limb lymph nodes: Secondary | ICD-10-CM | POA: Diagnosis not present

## 2019-05-07 DIAGNOSIS — Z9221 Personal history of antineoplastic chemotherapy: Secondary | ICD-10-CM | POA: Diagnosis not present

## 2019-05-07 DIAGNOSIS — Z9011 Acquired absence of right breast and nipple: Secondary | ICD-10-CM | POA: Diagnosis not present

## 2019-05-07 DIAGNOSIS — Z171 Estrogen receptor negative status [ER-]: Secondary | ICD-10-CM | POA: Diagnosis not present

## 2019-05-07 DIAGNOSIS — I972 Postmastectomy lymphedema syndrome: Secondary | ICD-10-CM | POA: Diagnosis not present

## 2019-05-07 DIAGNOSIS — C773 Secondary and unspecified malignant neoplasm of axilla and upper limb lymph nodes: Secondary | ICD-10-CM | POA: Diagnosis not present

## 2019-05-07 DIAGNOSIS — Z9012 Acquired absence of left breast and nipple: Secondary | ICD-10-CM | POA: Diagnosis not present

## 2019-05-07 DIAGNOSIS — C50411 Malignant neoplasm of upper-outer quadrant of right female breast: Secondary | ICD-10-CM | POA: Diagnosis not present

## 2019-05-07 DIAGNOSIS — Z9013 Acquired absence of bilateral breasts and nipples: Secondary | ICD-10-CM | POA: Diagnosis not present

## 2019-05-07 DIAGNOSIS — Z510 Encounter for antineoplastic radiation therapy: Secondary | ICD-10-CM | POA: Diagnosis not present

## 2019-05-08 DIAGNOSIS — Z9221 Personal history of antineoplastic chemotherapy: Secondary | ICD-10-CM | POA: Diagnosis not present

## 2019-05-08 DIAGNOSIS — C773 Secondary and unspecified malignant neoplasm of axilla and upper limb lymph nodes: Secondary | ICD-10-CM | POA: Diagnosis not present

## 2019-05-08 DIAGNOSIS — C50411 Malignant neoplasm of upper-outer quadrant of right female breast: Secondary | ICD-10-CM | POA: Diagnosis not present

## 2019-05-08 DIAGNOSIS — Z9013 Acquired absence of bilateral breasts and nipples: Secondary | ICD-10-CM | POA: Diagnosis not present

## 2019-05-08 DIAGNOSIS — Z171 Estrogen receptor negative status [ER-]: Secondary | ICD-10-CM | POA: Diagnosis not present

## 2019-05-08 DIAGNOSIS — Z510 Encounter for antineoplastic radiation therapy: Secondary | ICD-10-CM | POA: Diagnosis not present

## 2019-05-09 DIAGNOSIS — Z9221 Personal history of antineoplastic chemotherapy: Secondary | ICD-10-CM | POA: Diagnosis not present

## 2019-05-09 DIAGNOSIS — Z171 Estrogen receptor negative status [ER-]: Secondary | ICD-10-CM | POA: Diagnosis not present

## 2019-05-09 DIAGNOSIS — C50411 Malignant neoplasm of upper-outer quadrant of right female breast: Secondary | ICD-10-CM | POA: Diagnosis not present

## 2019-05-09 DIAGNOSIS — Z510 Encounter for antineoplastic radiation therapy: Secondary | ICD-10-CM | POA: Diagnosis not present

## 2019-05-09 DIAGNOSIS — C773 Secondary and unspecified malignant neoplasm of axilla and upper limb lymph nodes: Secondary | ICD-10-CM | POA: Diagnosis not present

## 2019-05-09 DIAGNOSIS — Z9013 Acquired absence of bilateral breasts and nipples: Secondary | ICD-10-CM | POA: Diagnosis not present

## 2019-05-12 DIAGNOSIS — Z9012 Acquired absence of left breast and nipple: Secondary | ICD-10-CM | POA: Diagnosis not present

## 2019-05-12 DIAGNOSIS — C50411 Malignant neoplasm of upper-outer quadrant of right female breast: Secondary | ICD-10-CM | POA: Diagnosis not present

## 2019-05-12 DIAGNOSIS — I972 Postmastectomy lymphedema syndrome: Secondary | ICD-10-CM | POA: Diagnosis not present

## 2019-05-12 DIAGNOSIS — Z510 Encounter for antineoplastic radiation therapy: Secondary | ICD-10-CM | POA: Diagnosis not present

## 2019-05-12 DIAGNOSIS — C773 Secondary and unspecified malignant neoplasm of axilla and upper limb lymph nodes: Secondary | ICD-10-CM | POA: Diagnosis not present

## 2019-05-12 DIAGNOSIS — Z9221 Personal history of antineoplastic chemotherapy: Secondary | ICD-10-CM | POA: Diagnosis not present

## 2019-05-12 DIAGNOSIS — Z9013 Acquired absence of bilateral breasts and nipples: Secondary | ICD-10-CM | POA: Diagnosis not present

## 2019-05-12 DIAGNOSIS — Z171 Estrogen receptor negative status [ER-]: Secondary | ICD-10-CM | POA: Diagnosis not present

## 2019-05-12 DIAGNOSIS — Z9011 Acquired absence of right breast and nipple: Secondary | ICD-10-CM | POA: Diagnosis not present

## 2019-05-30 DIAGNOSIS — Z171 Estrogen receptor negative status [ER-]: Secondary | ICD-10-CM | POA: Diagnosis not present

## 2019-05-30 DIAGNOSIS — C50811 Malignant neoplasm of overlapping sites of right female breast: Secondary | ICD-10-CM | POA: Diagnosis not present

## 2019-06-23 DIAGNOSIS — R918 Other nonspecific abnormal finding of lung field: Secondary | ICD-10-CM | POA: Diagnosis not present

## 2019-06-23 DIAGNOSIS — R600 Localized edema: Secondary | ICD-10-CM | POA: Diagnosis not present

## 2019-06-23 DIAGNOSIS — C50919 Malignant neoplasm of unspecified site of unspecified female breast: Secondary | ICD-10-CM | POA: Diagnosis not present

## 2019-06-23 DIAGNOSIS — R911 Solitary pulmonary nodule: Secondary | ICD-10-CM | POA: Diagnosis not present

## 2019-06-23 DIAGNOSIS — Z171 Estrogen receptor negative status [ER-]: Secondary | ICD-10-CM | POA: Diagnosis not present

## 2019-06-23 DIAGNOSIS — C50411 Malignant neoplasm of upper-outer quadrant of right female breast: Secondary | ICD-10-CM | POA: Diagnosis not present

## 2019-06-27 DIAGNOSIS — I890 Lymphedema, not elsewhere classified: Secondary | ICD-10-CM | POA: Diagnosis not present

## 2019-06-30 DIAGNOSIS — R600 Localized edema: Secondary | ICD-10-CM | POA: Diagnosis not present

## 2019-06-30 DIAGNOSIS — C50919 Malignant neoplasm of unspecified site of unspecified female breast: Secondary | ICD-10-CM | POA: Diagnosis not present

## 2019-06-30 DIAGNOSIS — Z171 Estrogen receptor negative status [ER-]: Secondary | ICD-10-CM | POA: Diagnosis not present

## 2019-07-02 DIAGNOSIS — Z20828 Contact with and (suspected) exposure to other viral communicable diseases: Secondary | ICD-10-CM | POA: Diagnosis not present

## 2019-07-02 DIAGNOSIS — Z171 Estrogen receptor negative status [ER-]: Secondary | ICD-10-CM | POA: Diagnosis not present

## 2019-07-02 DIAGNOSIS — Z01812 Encounter for preprocedural laboratory examination: Secondary | ICD-10-CM | POA: Diagnosis not present

## 2019-07-02 DIAGNOSIS — C50411 Malignant neoplasm of upper-outer quadrant of right female breast: Secondary | ICD-10-CM | POA: Diagnosis not present

## 2019-07-06 DIAGNOSIS — K529 Noninfective gastroenteritis and colitis, unspecified: Secondary | ICD-10-CM | POA: Diagnosis not present

## 2019-07-06 DIAGNOSIS — R1032 Left lower quadrant pain: Secondary | ICD-10-CM | POA: Diagnosis not present

## 2019-07-07 DIAGNOSIS — C50919 Malignant neoplasm of unspecified site of unspecified female breast: Secondary | ICD-10-CM | POA: Diagnosis not present

## 2019-07-07 DIAGNOSIS — Z171 Estrogen receptor negative status [ER-]: Secondary | ICD-10-CM | POA: Diagnosis not present

## 2019-07-11 DIAGNOSIS — C50811 Malignant neoplasm of overlapping sites of right female breast: Secondary | ICD-10-CM | POA: Diagnosis not present

## 2019-07-11 DIAGNOSIS — Z171 Estrogen receptor negative status [ER-]: Secondary | ICD-10-CM | POA: Diagnosis not present

## 2019-07-15 DIAGNOSIS — Z171 Estrogen receptor negative status [ER-]: Secondary | ICD-10-CM | POA: Diagnosis not present

## 2019-07-15 DIAGNOSIS — C50411 Malignant neoplasm of upper-outer quadrant of right female breast: Secondary | ICD-10-CM | POA: Diagnosis not present

## 2019-07-16 DIAGNOSIS — C50919 Malignant neoplasm of unspecified site of unspecified female breast: Secondary | ICD-10-CM | POA: Diagnosis not present

## 2019-07-16 DIAGNOSIS — Z171 Estrogen receptor negative status [ER-]: Secondary | ICD-10-CM | POA: Diagnosis not present

## 2019-07-22 DIAGNOSIS — I972 Postmastectomy lymphedema syndrome: Secondary | ICD-10-CM | POA: Diagnosis not present

## 2019-07-22 DIAGNOSIS — R29898 Other symptoms and signs involving the musculoskeletal system: Secondary | ICD-10-CM | POA: Diagnosis not present

## 2019-07-22 DIAGNOSIS — Z9010 Acquired absence of unspecified breast and nipple: Secondary | ICD-10-CM | POA: Diagnosis not present

## 2019-07-22 DIAGNOSIS — G8918 Other acute postprocedural pain: Secondary | ICD-10-CM | POA: Diagnosis not present

## 2019-07-23 DIAGNOSIS — C50919 Malignant neoplasm of unspecified site of unspecified female breast: Secondary | ICD-10-CM | POA: Diagnosis not present

## 2019-07-29 DIAGNOSIS — G8918 Other acute postprocedural pain: Secondary | ICD-10-CM | POA: Diagnosis not present

## 2019-07-29 DIAGNOSIS — I972 Postmastectomy lymphedema syndrome: Secondary | ICD-10-CM | POA: Diagnosis not present

## 2019-07-29 DIAGNOSIS — R29898 Other symptoms and signs involving the musculoskeletal system: Secondary | ICD-10-CM | POA: Diagnosis not present

## 2019-07-29 DIAGNOSIS — Z9010 Acquired absence of unspecified breast and nipple: Secondary | ICD-10-CM | POA: Diagnosis not present

## 2019-08-05 DIAGNOSIS — C50411 Malignant neoplasm of upper-outer quadrant of right female breast: Secondary | ICD-10-CM | POA: Diagnosis not present

## 2019-08-05 DIAGNOSIS — Z171 Estrogen receptor negative status [ER-]: Secondary | ICD-10-CM | POA: Diagnosis not present

## 2019-08-06 DIAGNOSIS — C50919 Malignant neoplasm of unspecified site of unspecified female breast: Secondary | ICD-10-CM | POA: Diagnosis not present

## 2019-08-06 DIAGNOSIS — Z171 Estrogen receptor negative status [ER-]: Secondary | ICD-10-CM | POA: Diagnosis not present

## 2019-08-20 DIAGNOSIS — Z006 Encounter for examination for normal comparison and control in clinical research program: Secondary | ICD-10-CM | POA: Diagnosis not present

## 2019-08-20 DIAGNOSIS — Z171 Estrogen receptor negative status [ER-]: Secondary | ICD-10-CM | POA: Diagnosis not present

## 2019-08-20 DIAGNOSIS — C50411 Malignant neoplasm of upper-outer quadrant of right female breast: Secondary | ICD-10-CM | POA: Diagnosis not present

## 2019-08-20 DIAGNOSIS — Z79899 Other long term (current) drug therapy: Secondary | ICD-10-CM | POA: Diagnosis not present

## 2019-08-20 DIAGNOSIS — Z9221 Personal history of antineoplastic chemotherapy: Secondary | ICD-10-CM | POA: Diagnosis not present

## 2019-08-20 DIAGNOSIS — I890 Lymphedema, not elsewhere classified: Secondary | ICD-10-CM | POA: Diagnosis not present

## 2019-08-27 DIAGNOSIS — C50411 Malignant neoplasm of upper-outer quadrant of right female breast: Secondary | ICD-10-CM | POA: Diagnosis not present

## 2019-08-27 DIAGNOSIS — Z79899 Other long term (current) drug therapy: Secondary | ICD-10-CM | POA: Diagnosis not present

## 2019-08-27 DIAGNOSIS — R591 Generalized enlarged lymph nodes: Secondary | ICD-10-CM | POA: Diagnosis not present

## 2019-08-27 DIAGNOSIS — Z171 Estrogen receptor negative status [ER-]: Secondary | ICD-10-CM | POA: Diagnosis not present

## 2019-08-27 DIAGNOSIS — C50211 Malignant neoplasm of upper-inner quadrant of right female breast: Secondary | ICD-10-CM | POA: Diagnosis not present

## 2019-09-13 DIAGNOSIS — I890 Lymphedema, not elsewhere classified: Secondary | ICD-10-CM | POA: Diagnosis not present

## 2019-09-18 DIAGNOSIS — C50411 Malignant neoplasm of upper-outer quadrant of right female breast: Secondary | ICD-10-CM | POA: Diagnosis not present

## 2019-09-18 DIAGNOSIS — C50919 Malignant neoplasm of unspecified site of unspecified female breast: Secondary | ICD-10-CM | POA: Diagnosis not present

## 2019-09-18 DIAGNOSIS — Z171 Estrogen receptor negative status [ER-]: Secondary | ICD-10-CM | POA: Diagnosis not present

## 2019-09-18 DIAGNOSIS — Z23 Encounter for immunization: Secondary | ICD-10-CM | POA: Diagnosis not present

## 2019-09-25 DIAGNOSIS — M25612 Stiffness of left shoulder, not elsewhere classified: Secondary | ICD-10-CM | POA: Diagnosis not present

## 2019-09-25 DIAGNOSIS — G8918 Other acute postprocedural pain: Secondary | ICD-10-CM | POA: Diagnosis not present

## 2019-09-25 DIAGNOSIS — R29898 Other symptoms and signs involving the musculoskeletal system: Secondary | ICD-10-CM | POA: Diagnosis not present

## 2019-09-25 DIAGNOSIS — M25611 Stiffness of right shoulder, not elsewhere classified: Secondary | ICD-10-CM | POA: Diagnosis not present

## 2019-09-25 DIAGNOSIS — I972 Postmastectomy lymphedema syndrome: Secondary | ICD-10-CM | POA: Diagnosis not present

## 2019-10-07 DIAGNOSIS — C50811 Malignant neoplasm of overlapping sites of right female breast: Secondary | ICD-10-CM | POA: Diagnosis not present

## 2019-10-07 DIAGNOSIS — Z171 Estrogen receptor negative status [ER-]: Secondary | ICD-10-CM | POA: Diagnosis not present

## 2019-10-07 DIAGNOSIS — C50411 Malignant neoplasm of upper-outer quadrant of right female breast: Secondary | ICD-10-CM | POA: Diagnosis not present

## 2019-10-13 DIAGNOSIS — G8918 Other acute postprocedural pain: Secondary | ICD-10-CM | POA: Diagnosis not present

## 2019-10-13 DIAGNOSIS — R29898 Other symptoms and signs involving the musculoskeletal system: Secondary | ICD-10-CM | POA: Diagnosis not present

## 2019-10-13 DIAGNOSIS — M25612 Stiffness of left shoulder, not elsewhere classified: Secondary | ICD-10-CM | POA: Diagnosis not present

## 2019-10-13 DIAGNOSIS — I972 Postmastectomy lymphedema syndrome: Secondary | ICD-10-CM | POA: Diagnosis not present

## 2019-10-13 DIAGNOSIS — M25611 Stiffness of right shoulder, not elsewhere classified: Secondary | ICD-10-CM | POA: Diagnosis not present

## 2019-10-23 DIAGNOSIS — Z6834 Body mass index (BMI) 34.0-34.9, adult: Secondary | ICD-10-CM | POA: Diagnosis not present

## 2019-10-23 DIAGNOSIS — Z01419 Encounter for gynecological examination (general) (routine) without abnormal findings: Secondary | ICD-10-CM | POA: Diagnosis not present

## 2019-10-27 DIAGNOSIS — G8918 Other acute postprocedural pain: Secondary | ICD-10-CM | POA: Diagnosis not present

## 2019-10-27 DIAGNOSIS — Z9010 Acquired absence of unspecified breast and nipple: Secondary | ICD-10-CM | POA: Diagnosis not present

## 2019-10-27 DIAGNOSIS — R531 Weakness: Secondary | ICD-10-CM | POA: Diagnosis not present

## 2019-10-27 DIAGNOSIS — I972 Postmastectomy lymphedema syndrome: Secondary | ICD-10-CM | POA: Diagnosis not present

## 2019-10-27 DIAGNOSIS — M25611 Stiffness of right shoulder, not elsewhere classified: Secondary | ICD-10-CM | POA: Diagnosis not present

## 2019-10-27 DIAGNOSIS — M25612 Stiffness of left shoulder, not elsewhere classified: Secondary | ICD-10-CM | POA: Diagnosis not present

## 2019-10-27 DIAGNOSIS — R29898 Other symptoms and signs involving the musculoskeletal system: Secondary | ICD-10-CM | POA: Diagnosis not present

## 2019-10-28 DIAGNOSIS — C50919 Malignant neoplasm of unspecified site of unspecified female breast: Secondary | ICD-10-CM | POA: Diagnosis not present

## 2019-10-28 DIAGNOSIS — C50911 Malignant neoplasm of unspecified site of right female breast: Secondary | ICD-10-CM | POA: Diagnosis not present

## 2019-10-28 DIAGNOSIS — Z171 Estrogen receptor negative status [ER-]: Secondary | ICD-10-CM | POA: Diagnosis not present

## 2019-10-28 DIAGNOSIS — I972 Postmastectomy lymphedema syndrome: Secondary | ICD-10-CM | POA: Diagnosis not present

## 2019-11-10 DIAGNOSIS — G8918 Other acute postprocedural pain: Secondary | ICD-10-CM | POA: Diagnosis not present

## 2019-11-10 DIAGNOSIS — R531 Weakness: Secondary | ICD-10-CM | POA: Diagnosis not present

## 2019-11-10 DIAGNOSIS — M25611 Stiffness of right shoulder, not elsewhere classified: Secondary | ICD-10-CM | POA: Diagnosis not present

## 2019-11-10 DIAGNOSIS — M25612 Stiffness of left shoulder, not elsewhere classified: Secondary | ICD-10-CM | POA: Diagnosis not present

## 2019-11-10 DIAGNOSIS — Z9010 Acquired absence of unspecified breast and nipple: Secondary | ICD-10-CM | POA: Diagnosis not present

## 2019-11-10 DIAGNOSIS — I972 Postmastectomy lymphedema syndrome: Secondary | ICD-10-CM | POA: Diagnosis not present

## 2019-11-10 DIAGNOSIS — R29898 Other symptoms and signs involving the musculoskeletal system: Secondary | ICD-10-CM | POA: Diagnosis not present

## 2019-11-17 DIAGNOSIS — R531 Weakness: Secondary | ICD-10-CM | POA: Diagnosis not present

## 2019-11-17 DIAGNOSIS — I972 Postmastectomy lymphedema syndrome: Secondary | ICD-10-CM | POA: Diagnosis not present

## 2019-11-17 DIAGNOSIS — G8918 Other acute postprocedural pain: Secondary | ICD-10-CM | POA: Diagnosis not present

## 2019-11-17 DIAGNOSIS — Z9010 Acquired absence of unspecified breast and nipple: Secondary | ICD-10-CM | POA: Diagnosis not present

## 2019-11-17 DIAGNOSIS — M25612 Stiffness of left shoulder, not elsewhere classified: Secondary | ICD-10-CM | POA: Diagnosis not present

## 2019-11-17 DIAGNOSIS — M25611 Stiffness of right shoulder, not elsewhere classified: Secondary | ICD-10-CM | POA: Diagnosis not present

## 2019-11-18 DIAGNOSIS — T82898A Other specified complication of vascular prosthetic devices, implants and grafts, initial encounter: Secondary | ICD-10-CM | POA: Diagnosis not present

## 2019-11-18 DIAGNOSIS — C50411 Malignant neoplasm of upper-outer quadrant of right female breast: Secondary | ICD-10-CM | POA: Diagnosis not present

## 2019-11-18 DIAGNOSIS — Y838 Other surgical procedures as the cause of abnormal reaction of the patient, or of later complication, without mention of misadventure at the time of the procedure: Secondary | ICD-10-CM | POA: Diagnosis not present

## 2019-11-18 DIAGNOSIS — Z171 Estrogen receptor negative status [ER-]: Secondary | ICD-10-CM | POA: Diagnosis not present

## 2019-11-24 DIAGNOSIS — I972 Postmastectomy lymphedema syndrome: Secondary | ICD-10-CM | POA: Diagnosis not present

## 2019-11-24 DIAGNOSIS — Z9010 Acquired absence of unspecified breast and nipple: Secondary | ICD-10-CM | POA: Diagnosis not present

## 2019-11-24 DIAGNOSIS — M25611 Stiffness of right shoulder, not elsewhere classified: Secondary | ICD-10-CM | POA: Diagnosis not present

## 2019-11-24 DIAGNOSIS — R531 Weakness: Secondary | ICD-10-CM | POA: Diagnosis not present

## 2019-11-24 DIAGNOSIS — M25612 Stiffness of left shoulder, not elsewhere classified: Secondary | ICD-10-CM | POA: Diagnosis not present

## 2019-11-24 DIAGNOSIS — G8918 Other acute postprocedural pain: Secondary | ICD-10-CM | POA: Diagnosis not present

## 2019-11-25 DIAGNOSIS — L578 Other skin changes due to chronic exposure to nonionizing radiation: Secondary | ICD-10-CM | POA: Diagnosis not present

## 2019-11-25 DIAGNOSIS — Z872 Personal history of diseases of the skin and subcutaneous tissue: Secondary | ICD-10-CM | POA: Diagnosis not present

## 2019-11-25 DIAGNOSIS — L570 Actinic keratosis: Secondary | ICD-10-CM | POA: Diagnosis not present

## 2019-11-25 DIAGNOSIS — Z808 Family history of malignant neoplasm of other organs or systems: Secondary | ICD-10-CM | POA: Diagnosis not present

## 2019-12-02 DIAGNOSIS — M25611 Stiffness of right shoulder, not elsewhere classified: Secondary | ICD-10-CM | POA: Diagnosis not present

## 2019-12-02 DIAGNOSIS — I972 Postmastectomy lymphedema syndrome: Secondary | ICD-10-CM | POA: Diagnosis not present

## 2019-12-02 DIAGNOSIS — M25612 Stiffness of left shoulder, not elsewhere classified: Secondary | ICD-10-CM | POA: Diagnosis not present

## 2019-12-02 DIAGNOSIS — Z9010 Acquired absence of unspecified breast and nipple: Secondary | ICD-10-CM | POA: Diagnosis not present

## 2019-12-02 DIAGNOSIS — G8918 Other acute postprocedural pain: Secondary | ICD-10-CM | POA: Diagnosis not present

## 2019-12-02 DIAGNOSIS — R531 Weakness: Secondary | ICD-10-CM | POA: Diagnosis not present

## 2019-12-15 DIAGNOSIS — C50411 Malignant neoplasm of upper-outer quadrant of right female breast: Secondary | ICD-10-CM | POA: Diagnosis not present

## 2019-12-15 DIAGNOSIS — C171 Malignant neoplasm of jejunum: Secondary | ICD-10-CM | POA: Diagnosis not present

## 2019-12-15 DIAGNOSIS — C50919 Malignant neoplasm of unspecified site of unspecified female breast: Secondary | ICD-10-CM | POA: Diagnosis not present

## 2019-12-16 DIAGNOSIS — C50919 Malignant neoplasm of unspecified site of unspecified female breast: Secondary | ICD-10-CM | POA: Diagnosis not present

## 2019-12-16 DIAGNOSIS — Z171 Estrogen receptor negative status [ER-]: Secondary | ICD-10-CM | POA: Diagnosis not present

## 2019-12-17 DIAGNOSIS — I972 Postmastectomy lymphedema syndrome: Secondary | ICD-10-CM | POA: Diagnosis not present

## 2019-12-17 DIAGNOSIS — M25611 Stiffness of right shoulder, not elsewhere classified: Secondary | ICD-10-CM | POA: Diagnosis not present

## 2019-12-17 DIAGNOSIS — G8918 Other acute postprocedural pain: Secondary | ICD-10-CM | POA: Diagnosis not present

## 2019-12-17 DIAGNOSIS — R531 Weakness: Secondary | ICD-10-CM | POA: Diagnosis not present

## 2019-12-17 DIAGNOSIS — Z9011 Acquired absence of right breast and nipple: Secondary | ICD-10-CM | POA: Diagnosis not present

## 2019-12-17 DIAGNOSIS — M25612 Stiffness of left shoulder, not elsewhere classified: Secondary | ICD-10-CM | POA: Diagnosis not present

## 2019-12-19 DIAGNOSIS — Z171 Estrogen receptor negative status [ER-]: Secondary | ICD-10-CM | POA: Diagnosis not present

## 2019-12-19 DIAGNOSIS — C50811 Malignant neoplasm of overlapping sites of right female breast: Secondary | ICD-10-CM | POA: Diagnosis not present

## 2019-12-22 DIAGNOSIS — Z9011 Acquired absence of right breast and nipple: Secondary | ICD-10-CM | POA: Diagnosis not present

## 2019-12-22 DIAGNOSIS — I972 Postmastectomy lymphedema syndrome: Secondary | ICD-10-CM | POA: Diagnosis not present

## 2019-12-22 DIAGNOSIS — M25612 Stiffness of left shoulder, not elsewhere classified: Secondary | ICD-10-CM | POA: Diagnosis not present

## 2019-12-22 DIAGNOSIS — M25611 Stiffness of right shoulder, not elsewhere classified: Secondary | ICD-10-CM | POA: Diagnosis not present

## 2019-12-22 DIAGNOSIS — R531 Weakness: Secondary | ICD-10-CM | POA: Diagnosis not present

## 2019-12-22 DIAGNOSIS — G8918 Other acute postprocedural pain: Secondary | ICD-10-CM | POA: Diagnosis not present

## 2020-01-06 DIAGNOSIS — Z171 Estrogen receptor negative status [ER-]: Secondary | ICD-10-CM | POA: Diagnosis not present

## 2020-01-06 DIAGNOSIS — J929 Pleural plaque without asbestos: Secondary | ICD-10-CM | POA: Diagnosis not present

## 2020-01-06 DIAGNOSIS — C50919 Malignant neoplasm of unspecified site of unspecified female breast: Secondary | ICD-10-CM | POA: Diagnosis not present

## 2020-01-19 DIAGNOSIS — Z6833 Body mass index (BMI) 33.0-33.9, adult: Secondary | ICD-10-CM | POA: Diagnosis not present

## 2020-01-19 DIAGNOSIS — I890 Lymphedema, not elsewhere classified: Secondary | ICD-10-CM | POA: Diagnosis not present

## 2020-01-19 DIAGNOSIS — Z421 Encounter for breast reconstruction following mastectomy: Secondary | ICD-10-CM | POA: Diagnosis not present

## 2020-01-19 DIAGNOSIS — E6609 Other obesity due to excess calories: Secondary | ICD-10-CM | POA: Diagnosis not present

## 2020-02-18 DIAGNOSIS — Z171 Estrogen receptor negative status [ER-]: Secondary | ICD-10-CM | POA: Diagnosis not present

## 2020-02-18 DIAGNOSIS — I890 Lymphedema, not elsewhere classified: Secondary | ICD-10-CM | POA: Diagnosis not present

## 2020-02-18 DIAGNOSIS — Z08 Encounter for follow-up examination after completed treatment for malignant neoplasm: Secondary | ICD-10-CM | POA: Diagnosis not present

## 2020-02-18 DIAGNOSIS — Z452 Encounter for adjustment and management of vascular access device: Secondary | ICD-10-CM | POA: Diagnosis not present

## 2020-02-18 DIAGNOSIS — C50111 Malignant neoplasm of central portion of right female breast: Secondary | ICD-10-CM | POA: Diagnosis not present

## 2020-02-18 DIAGNOSIS — Z853 Personal history of malignant neoplasm of breast: Secondary | ICD-10-CM | POA: Diagnosis not present

## 2020-02-26 DIAGNOSIS — Z23 Encounter for immunization: Secondary | ICD-10-CM | POA: Diagnosis not present

## 2020-03-02 DIAGNOSIS — Z171 Estrogen receptor negative status [ER-]: Secondary | ICD-10-CM | POA: Diagnosis not present

## 2020-03-02 DIAGNOSIS — Z923 Personal history of irradiation: Secondary | ICD-10-CM | POA: Diagnosis not present

## 2020-03-02 DIAGNOSIS — C50811 Malignant neoplasm of overlapping sites of right female breast: Secondary | ICD-10-CM | POA: Diagnosis not present

## 2020-03-02 DIAGNOSIS — C50411 Malignant neoplasm of upper-outer quadrant of right female breast: Secondary | ICD-10-CM | POA: Diagnosis not present

## 2020-03-02 DIAGNOSIS — Z9221 Personal history of antineoplastic chemotherapy: Secondary | ICD-10-CM | POA: Diagnosis not present

## 2020-03-18 DIAGNOSIS — Z23 Encounter for immunization: Secondary | ICD-10-CM | POA: Diagnosis not present

## 2020-03-25 DIAGNOSIS — M25559 Pain in unspecified hip: Secondary | ICD-10-CM | POA: Diagnosis not present

## 2020-03-25 DIAGNOSIS — M25552 Pain in left hip: Secondary | ICD-10-CM | POA: Diagnosis not present

## 2020-03-25 DIAGNOSIS — Z853 Personal history of malignant neoplasm of breast: Secondary | ICD-10-CM | POA: Diagnosis not present

## 2020-03-25 DIAGNOSIS — Z171 Estrogen receptor negative status [ER-]: Secondary | ICD-10-CM | POA: Diagnosis not present

## 2020-03-25 DIAGNOSIS — C50919 Malignant neoplasm of unspecified site of unspecified female breast: Secondary | ICD-10-CM | POA: Diagnosis not present

## 2020-03-26 DIAGNOSIS — C50811 Malignant neoplasm of overlapping sites of right female breast: Secondary | ICD-10-CM | POA: Diagnosis not present

## 2020-03-26 DIAGNOSIS — C50919 Malignant neoplasm of unspecified site of unspecified female breast: Secondary | ICD-10-CM | POA: Diagnosis not present

## 2020-03-26 DIAGNOSIS — M25552 Pain in left hip: Secondary | ICD-10-CM | POA: Diagnosis not present

## 2020-03-26 DIAGNOSIS — Z171 Estrogen receptor negative status [ER-]: Secondary | ICD-10-CM | POA: Diagnosis not present

## 2020-03-30 DIAGNOSIS — C50919 Malignant neoplasm of unspecified site of unspecified female breast: Secondary | ICD-10-CM | POA: Diagnosis not present

## 2020-03-30 DIAGNOSIS — M7062 Trochanteric bursitis, left hip: Secondary | ICD-10-CM | POA: Diagnosis not present

## 2020-03-30 DIAGNOSIS — Z171 Estrogen receptor negative status [ER-]: Secondary | ICD-10-CM | POA: Diagnosis not present

## 2020-05-04 DIAGNOSIS — M7062 Trochanteric bursitis, left hip: Secondary | ICD-10-CM | POA: Diagnosis not present

## 2020-05-21 ENCOUNTER — Telehealth: Payer: Self-pay

## 2020-05-21 ENCOUNTER — Emergency Department
Admission: RE | Admit: 2020-05-21 | Discharge: 2020-05-21 | Disposition: A | Discharge status: Home / Self Care | Payer: BC Managed Care – PPO | Source: Ambulatory Visit | Attending: Family Medicine | Admitting: Family Medicine

## 2020-05-21 ENCOUNTER — Other Ambulatory Visit: Payer: Self-pay

## 2020-05-21 VITALS — BP 119/85 | HR 91 | Temp 99.0°F | Resp 16

## 2020-05-21 DIAGNOSIS — J0100 Acute maxillary sinusitis, unspecified: Secondary | ICD-10-CM

## 2020-05-21 DIAGNOSIS — J069 Acute upper respiratory infection, unspecified: Secondary | ICD-10-CM | POA: Diagnosis not present

## 2020-05-21 MED ORDER — DOXYCYCLINE HYCLATE 100 MG PO CAPS: ORAL_CAPSULE | ORAL | 0 refills | Status: DC

## 2020-05-21 NOTE — Telephone Encounter (Signed)
Pt called requesting an appt to see provider. No appts available. Pt has not been feeling well. She is having scratchy throat, nasal congestion with drainage x - 3 days. Pt was given an appt with Jade B on 05/24/20. Pt was informed if symptoms get worse for her to seek care at a UC/ER location. She was agreeable with plan.

## 2020-05-21 NOTE — ED Provider Notes (Signed)
[facility]    CSN: [id number] Arrival date & time: 05/21/20  1549      History   Chief Complaint Chief Complaint  Patient presents with  . Appointment    4:00  . Sore Throat  . Cough    HPI Tina Hart is a 44 y.o. female.   Patient complains of two day history of typical cold-like symptoms including mild sore throat, sinus congestion, headache, fatigue, and cough.  She denies fevers, chills, and sweats.  She denies pleuritic pain and shortness of breath.  She has a history of breast CA. She has had COVID19 and pneumococcal vaccinations.  The history is provided by the patient.    Past Medical History:  Diagnosis Date  . Anxiety   . Cancer ([place])   . GERD (gastroesophageal reflux disease)    WITH PREGNANCY  . Headache   . Hyperthyroidism   . PONV (postoperative nausea and vomiting)   . Post partum depression     Patient Active Problem List   Diagnosis Date Noted  . Malignant neoplasm of upper-outer quadrant of right breast in female, estrogen receptor negative ([place]) 08/09/2018  . Breast cancer metastasized to axillary lymph node, right ([place]) 08/05/2018  . Endometriosis 09/18/2016  . Plantar fasciitis, bilateral 10/13/2015  . Postpartum care following cesarean delivery & BTL (11/20) 10/02/2014  . HYPOTHYROIDISM 09/04/2011  . Acute upper respiratory infection 12/27/2010  . MIGRAINE HEADACHE 09/29/2010  . HERPES LABIALIS 06/22/2008    Past Surgical History:  Procedure Laterality Date  . CARPAL TUNNEL RELEASE Right 06/2016  . CESAREAN SECTION    . CESAREAN SECTION WITH BILATERAL TUBAL LIGATION Bilateral 10/02/2014   Procedure: Repeat CESAREAN SECTION WITH BILATERAL TUBAL LIGATION;  Surgeon: Lovenia Kim, MD;  Location: [facility];  Service: Obstetrics;  Laterality: Bilateral;  EDD: 10/09/14  . COLONOSCOPY    . ENDOMETRIAL ABLATION W/ NOVASURE  03/2015  . OOPHORECTOMY Left 09/18/2016   Procedure: OOPHORECTOMY;  Surgeon: Brien Few, MD;   Location: [facility];  Service: Gynecology;  Laterality: Left;  . ROBOTIC ASSISTED TOTAL HYSTERECTOMY WITH SALPINGECTOMY Bilateral 09/18/2016   Procedure: ROBOTIC ASSISTED TOTAL HYSTERECTOMY WITH SALPINGECTOMY;  Surgeon: Brien Few, MD;  Location: [facility];  Service: Gynecology;  Laterality: Bilateral;  . WISDOM TOOTH EXTRACTION      OB History    Gravida  2   Para  2   Term  2   Preterm  0   AB  0   Living  1     SAB  0   TAB  0   Ectopic  0   Multiple  0   Live Births  1            Home Medications    Prior to Admission medications   Medication Sig Start Date End Date Taking? Authorizing Provider  chlorpheniramine-HYDROcodone (TUSSIONEX) 10-8 MG/5ML SUER Take 5 mLs by mouth every 12 (twelve) hours as needed. 01/06/19   Breeback, Luvenia Starch L, PA-C  doxycycline (VIBRAMYCIN) 100 MG capsule Take one cap PO Q12hr with food. 05/21/20   Kandra Nicolas, MD  levothyroxine (SYNTHROID, LEVOTHROID) 75 MCG tablet Take 75 mcg by mouth daily before breakfast.     [provider]  magic mouthwash w/lidocaine SOLN Take 5-10 mLs by mouth 3 (three) times daily as needed for mouth pain (for sore throat). Swish, gargle for up to 60 seconds. Spit. May swallow for sore throat. 12/03/18   Noe Gens, PA-C  sertraline (ZOLOFT) 25 MG  tablet Take 3 tablets (75 mg total) by mouth daily. 10/05/14   Artelia Laroche, CNM  valACYclovir (VALTREX) 500 MG tablet Take 500 mg by mouth 2 (two) times daily as needed (for cold sores).    [provider]  ZOLMitriptan (ZOMIG) 2.5 MG tablet Take 2.5 mg by mouth every 2 (two) hours as needed for migraine.     [provider]    Family History Family History  Problem Relation Age of Onset  . Breast cancer Mother 57  . Pancreatic cancer Mother 19    Social History Social History   Tobacco Use  . Smoking status: Never Smoker  . Smokeless tobacco: Never Used  Vaping Use  . Vaping Use: Never used  Substance Use Topics  . Alcohol  use: Not Currently  . Drug use: No     Allergies   Erythromycin and Sulfonamide derivatives   Review of Systems Review of Systems + sore throat + cough No pleuritic pain No wheezing + nasal congestion + post-nasal drainage No sinus pain/pressure No itchy/red eyes No earache No hemoptysis No SOB No fever/chills No nausea No vomiting No abdominal pain No diarrhea No urinary symptoms No skin rash + fatigue No myalgias + headache Used OTC meds (Tylenol) without relief   Physical Exam Triage Vital Signs ED Triage Vitals  Enc Vitals Group     BP 05/21/20 1602 119/85     Pulse Rate 05/21/20 1602 91     Resp 05/21/20 1602 16     Temp 05/21/20 1602 99 F (37.2 C)     Temp Source 05/21/20 1602 Oral     SpO2 05/21/20 1602 97 %     Weight --      Height --      Head Circumference --      Peak Flow --      Pain Score 05/21/20 1600 0     Pain Loc --      Pain Edu? --      Excl. in [place]? --    No data found.  Updated Vital Signs BP 119/85 (BP Location: Left Arm)   Pulse 91   Temp 99 F (37.2 C) (Oral)   Resp 16   LMP 06/13/2016   SpO2 97%   Visual Acuity Right Eye Distance:   Left Eye Distance:   Bilateral Distance:    Right Eye Near:   Left Eye Near:    Bilateral Near:     Physical Exam Nursing notes and Vital Signs reviewed. Appearance:  Patient appears stated age, and in no acute distress Eyes:  Pupils are equal, round, and reactive to light and accomodation.  Extraocular movement is intact.  Conjunctivae are not inflamed  Ears:  Canals normal.  Tympanic membranes normal.  Nose:  Mildly congested turbinates.  No sinus tenderness.  Pharynx:  Normal Neck:  Supple.  Mildly enlarged lateral nodes are present, tender to palpation on the left.   Lungs:  Clear to auscultation.  Breath sounds are equal.  Moving air well. Heart:  Regular rate and rhythm without murmurs, rubs, or gallops.  Abdomen:  Nontender without masses or hepatosplenomegaly.  Bowel  sounds are present.  No CVA or flank tenderness.  Extremities:  No edema.  Skin:  No rash present.   UC Treatments / Results  Labs (all labs ordered are listed, but only abnormal results are displayed) Labs Reviewed - No data to display  EKG   Radiology No results found.  Procedures Procedures (including  critical care time)  Medications Ordered in UC Medications - No data to display  Initial Impression / Assessment and Plan / UC Course  I have reviewed the triage vital signs and the nursing notes.  Pertinent labs & imaging results that were available during my care of the patient were reviewed by me and considered in my medical decision making (see chart for details).    Because of history of breast ca and immune suppression, will begin doxycycline. Followup with Family Doctor if not improved in about 10 days.   Final Clinical Impressions(s) / UC Diagnoses   Final diagnoses:  Viral URI with cough     Discharge Instructions     Take plain guaifenesin (1200mg  extended release tabs such as Mucinex) twice daily, with plenty of water, for cough and congestion.  May add Pseudoephedrine (30mg , one or two every 4 to 6 hours) for sinus congestion.  Get adequate rest.   May use Afrin nasal spray (or generic oxymetazoline) each morning for about 5 days and then discontinue.  Also recommend using saline nasal spray several times daily and saline nasal irrigation (AYR is a common brand).  Use Flonase nasal spray each morning after using Afrin nasal spray and saline nasal irrigation. Try warm salt water gargles for sore throat.  Stop all antihistamines for now, and other non-prescription cough/cold preparations. May take Tylenol as needed for pain, fever, etc. May take Delsym Cough Suppressant at bedtime for nighttime cough.    ED Prescriptions    Medication Sig Dispense Auth. Provider   doxycycline (VIBRAMYCIN) 100 MG capsule Take one cap PO Q12hr with food. 14 capsule Kandra Nicolas, MD        Kandra Nicolas, MD 05/22/20 2121

## 2020-05-21 NOTE — ED Triage Notes (Signed)
Patient presents to Urgent Care with complaints of productive cough with yellow mucous since a few days ago. Patient reports she went into breast cancer remission earlier this year and her oncologist recommended she come be seen, pt states she has had the covid, flu, and pneumonia vaccines. Is concerned w/ possible bronchitis.

## 2020-05-21 NOTE — Discharge Instructions (Addendum)
Take plain guaifenesin (1200mg  extended release tabs such as Mucinex) twice daily, with plenty of water, for cough and congestion.  May add Pseudoephedrine (30mg , one or two every 4 to 6 hours) for sinus congestion.  Get adequate rest.   May use Afrin nasal spray (or generic oxymetazoline) each morning for about 5 days and then discontinue.  Also recommend using saline nasal spray several times daily and saline nasal irrigation (AYR is a common brand).  Use Flonase nasal spray each morning after using Afrin nasal spray and saline nasal irrigation. Try warm salt water gargles for sore throat.  Stop all antihistamines for now, and other non-prescription cough/cold preparations. May take Tylenol as needed for pain, fever, etc. May take Delsym Cough Suppressant at bedtime for nighttime cough.

## 2020-05-24 ENCOUNTER — Encounter: Payer: Self-pay | Admitting: Physician Assistant

## 2020-05-24 ENCOUNTER — Other Ambulatory Visit: Payer: Self-pay

## 2020-05-24 ENCOUNTER — Ambulatory Visit (INDEPENDENT_AMBULATORY_CARE_PROVIDER_SITE_OTHER): Payer: BC Managed Care – PPO | Admitting: Physician Assistant

## 2020-05-24 VITALS — BP 116/63 | HR 62 | Temp 98.5°F | Ht 62.0 in | Wt 172.0 lb

## 2020-05-24 DIAGNOSIS — B009 Herpesviral infection, unspecified: Secondary | ICD-10-CM

## 2020-05-24 DIAGNOSIS — J0100 Acute maxillary sinusitis, unspecified: Secondary | ICD-10-CM | POA: Diagnosis not present

## 2020-05-24 DIAGNOSIS — G43009 Migraine without aura, not intractable, without status migrainosus: Secondary | ICD-10-CM | POA: Diagnosis not present

## 2020-05-24 MED ORDER — ZOLMITRIPTAN 2.5 MG PO TABS: 2.5000 mg | ORAL_TABLET | ORAL | 1 refills | Status: AC | PRN

## 2020-05-24 MED ORDER — METHYLPREDNISOLONE 4 MG PO TBPK: ORAL_TABLET | ORAL | 0 refills | Status: DC

## 2020-05-24 MED ORDER — VALACYCLOVIR HCL 500 MG PO TABS: 500.0000 mg | ORAL_TABLET | Freq: Two times a day (BID) | ORAL | 0 refills | Status: AC | PRN

## 2020-05-24 NOTE — Progress Notes (Signed)
Started last Wednesday: Congestion/cough/drainage  Saw Urgent care Friday - put on doxy Also taking mucinex/delsym  Moved to head Ears popping Nasal congestion

## 2020-05-24 NOTE — Progress Notes (Signed)
Subjective:    Patient ID: Tina Hart, female    DOB: 07-27-1976, 44 y.o.   MRN: [id number]  HPI  Pt is a 44 yo female with sinus infection and seen at [facility] on Friday and started doxycycline. She is 50 percent better but still has a lot of ear popping, nasal congestion, head congestion. Her cough and drainage has improved. She is taking mucinex and delsym. No fever, chills, body aches.   She needs refill on zomig for as needed use. Having about 1 migraine a month.   Needs valtrex for herpes outbreak. Not had recently.  .. Active Ambulatory Problems    Diagnosis Date Noted  . 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 Migraine headache 09/29/2010  . HYPOTHYROIDISM 09/04/2011  . Postpartum care following cesarean delivery & BTL (11/20) 10/02/2014  . Plantar fasciitis, bilateral 10/13/2015  . Endometriosis 09/18/2016  . Breast cancer metastasized to axillary lymph node, right ([place]) 08/05/2018  . Malignant neoplasm of upper-outer quadrant of right breast in female, estrogen receptor negative ([place]) 08/09/2018   Resolved Ambulatory Problems    Diagnosis Date Noted  . Acute pharyngitis 12/27/2010  . Acute upper respiratory infection 12/27/2010  . Cesarean delivery delivered 10/02/2014   Past Medical History:  Diagnosis Date  . Anxiety   . Cancer ([place])   . GERD (gastroesophageal reflux disease)   . Headache   . Hyperthyroidism   . PONV (postoperative nausea and vomiting)   . Post partum depression      Review of Systems  All other systems reviewed and are negative.      Objective:   Physical Exam Vitals reviewed.  Constitutional:      Appearance: Normal appearance.  HENT:     Head: Normocephalic.     Right Ear: Tympanic membrane, ear canal and external ear normal. There is no impacted cerumen.     Left Ear: Tympanic membrane, ear canal and external ear normal. There is no impacted cerumen.     Ears:     Comments: Sinus tenderness to palpation maxillary.      Nose: Congestion present.     Mouth/Throat:     Mouth: Mucous membranes are moist.  Eyes:     Extraocular Movements: Extraocular movements intact.     Conjunctiva/sclera: Conjunctivae normal.     Pupils: Pupils are equal, round, and reactive to light.  Cardiovascular:     Rate and Rhythm: Normal rate and regular rhythm.     Pulses: Normal pulses.  Pulmonary:     Effort: Pulmonary effort is normal.     Breath sounds: Normal breath sounds.  Lymphadenopathy:     Cervical: No cervical adenopathy.  Neurological:     General: No focal deficit present.     Mental Status: She is alert and oriented to person, place, and time.  Psychiatric:        Mood and Affect: Mood normal.           Assessment & Plan:  Marland KitchenMarland KitchenDiagnoses and all orders for this visit:  Acute non-recurrent maxillary sinusitis -     methylPREDNISolone (MEDROL DOSEPAK) 4 MG TBPK tablet; Take as directed by package insert.  Migraine without aura and without status migrainosus, not intractable -     ZOLMitriptan (ZOMIG) 2.5 MG tablet; Take 1 tablet (2.5 mg total) by mouth every 2 (two) hours as needed for migraine.  Herpes simplex virus (HSV) infection -     valACYclovir (VALTREX) 500 MG tablet; Take 1 tablet (500 mg  total) by mouth 2 (two) times daily as needed (for cold sores).   Added medrol dose pak to doxycycline. Consider flonase. Rest and hydrate. Should be feeling better in next few days.   Refilled zomig and valtrex.

## 2020-06-29 DIAGNOSIS — R911 Solitary pulmonary nodule: Secondary | ICD-10-CM | POA: Diagnosis not present

## 2020-06-29 DIAGNOSIS — Z171 Estrogen receptor negative status [ER-]: Secondary | ICD-10-CM | POA: Diagnosis not present

## 2020-06-29 DIAGNOSIS — Z9221 Personal history of antineoplastic chemotherapy: Secondary | ICD-10-CM | POA: Diagnosis not present

## 2020-06-29 DIAGNOSIS — C50919 Malignant neoplasm of unspecified site of unspecified female breast: Secondary | ICD-10-CM | POA: Diagnosis not present

## 2020-06-29 DIAGNOSIS — C50911 Malignant neoplasm of unspecified site of right female breast: Secondary | ICD-10-CM | POA: Diagnosis not present

## 2020-06-29 DIAGNOSIS — Z923 Personal history of irradiation: Secondary | ICD-10-CM | POA: Diagnosis not present

## 2020-06-29 DIAGNOSIS — C50411 Malignant neoplasm of upper-outer quadrant of right female breast: Secondary | ICD-10-CM | POA: Diagnosis not present

## 2020-07-01 DIAGNOSIS — R911 Solitary pulmonary nodule: Secondary | ICD-10-CM | POA: Diagnosis not present

## 2020-07-01 DIAGNOSIS — C50919 Malignant neoplasm of unspecified site of unspecified female breast: Secondary | ICD-10-CM | POA: Diagnosis not present

## 2020-07-01 DIAGNOSIS — C50411 Malignant neoplasm of upper-outer quadrant of right female breast: Secondary | ICD-10-CM | POA: Diagnosis not present

## 2020-08-14 IMAGING — US ULTRASOUND RIGHT BREAST LIMITED
1 series · 13 of 15 positions shown · non-contrast
Comparison: Previous exam(s).

CLINICAL DATA: Patient presents for additional views of the right
breast as follow-up to recent screening exam to evaluate a possible
mass.

EXAM:
DIGITAL DIAGNOSTIC right MAMMOGRAM WITH TOMO
ULTRASOUND right BREAST

[Series 1: ultrasound right breast limited · 0.09mm/px · 13 of 15 slices shown]
[im 1/15]
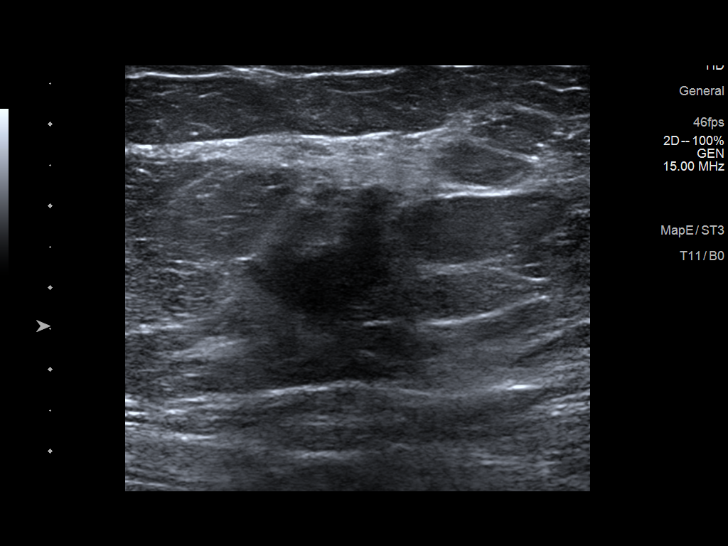
[im 2/15]
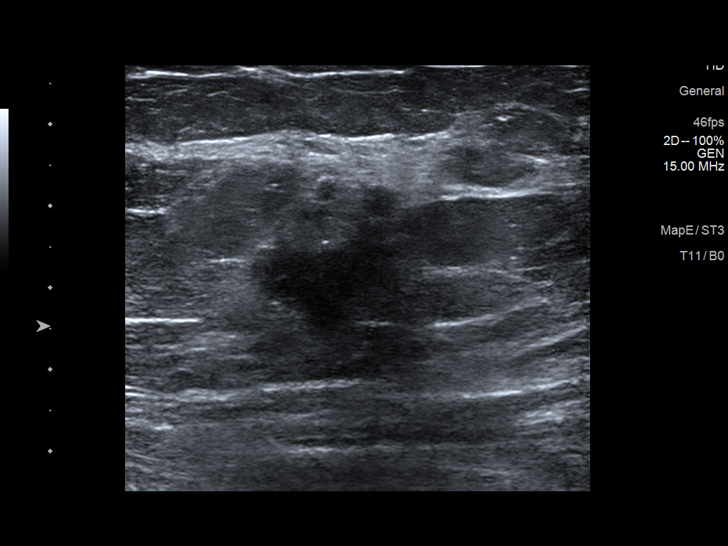
[im 3/15]
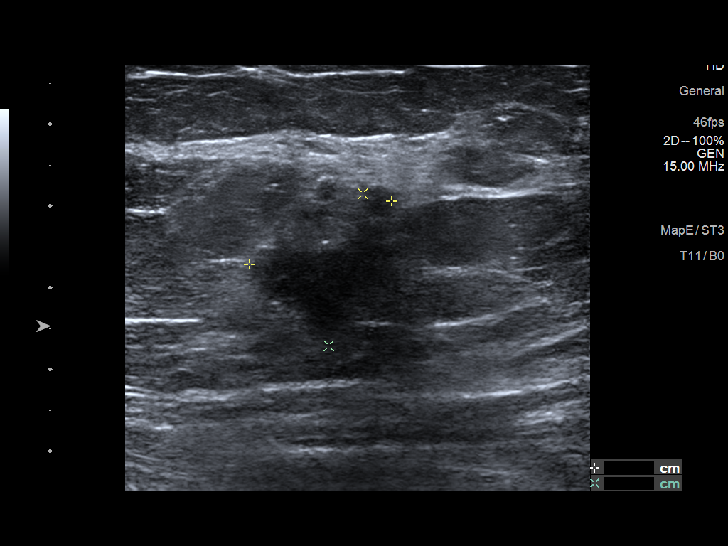
[im 5/15]
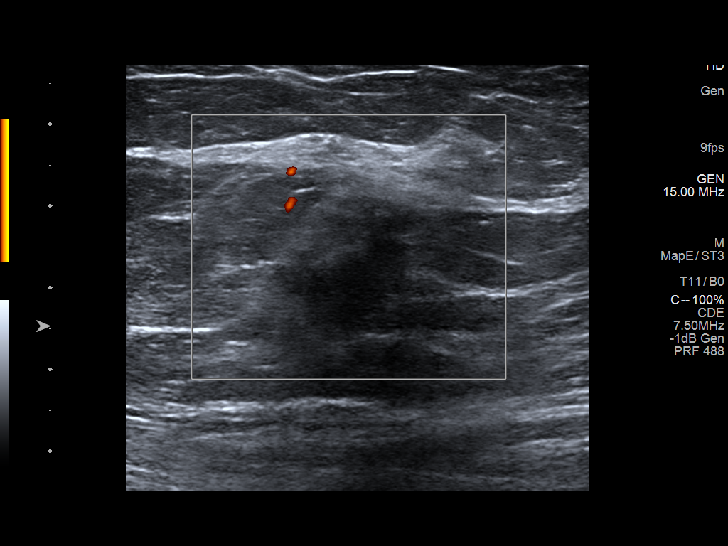
[im 6/15]
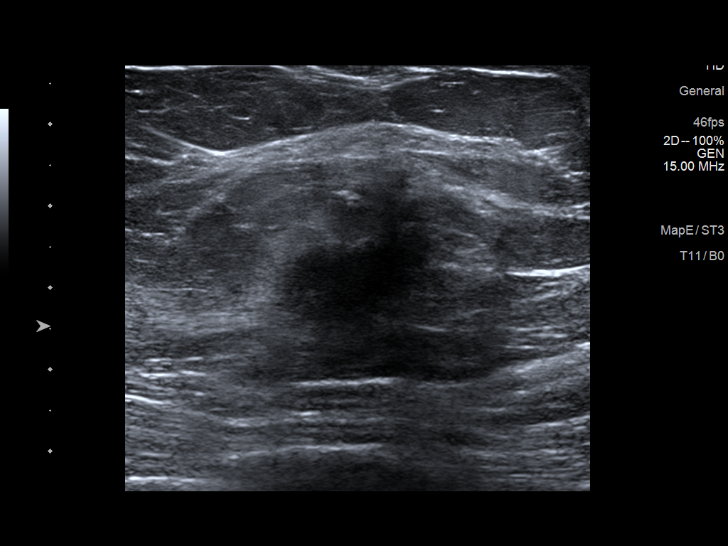
[im 7/15]
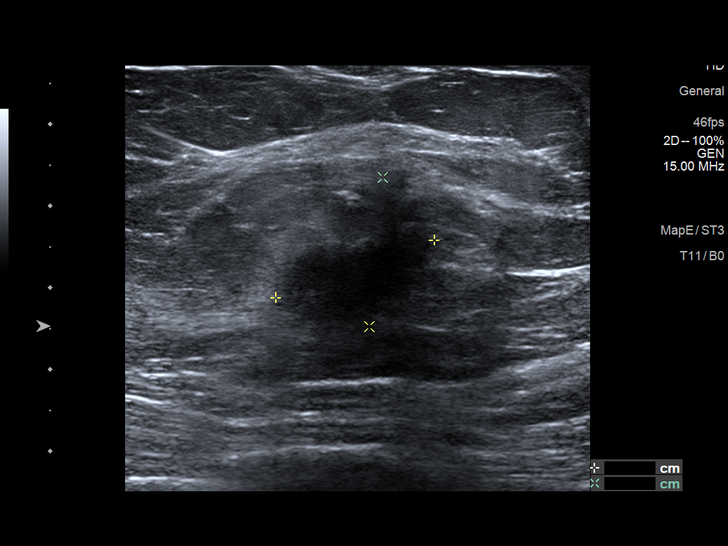
[im 8/15]
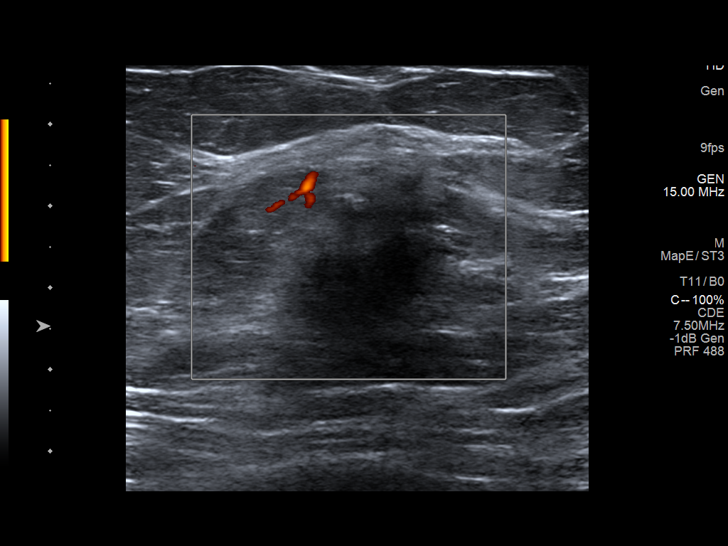
[im 9/15]
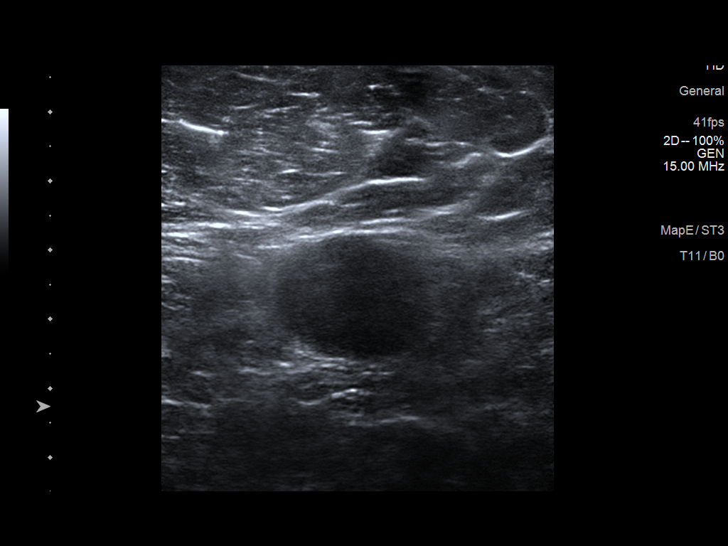
[im 10/15]
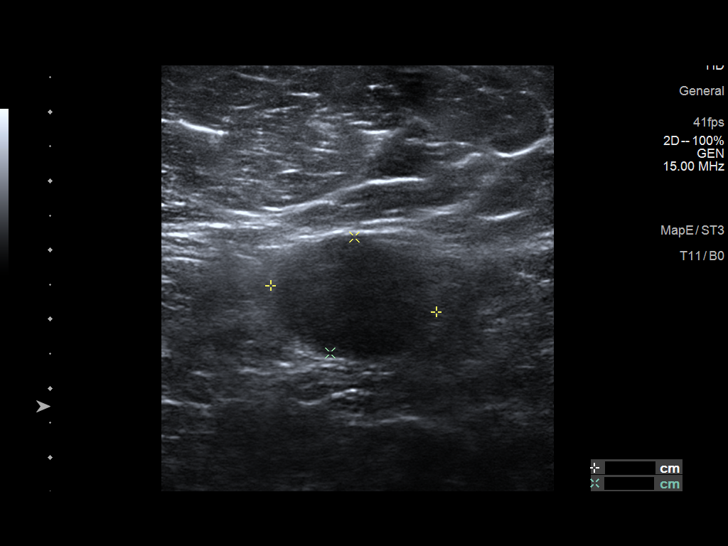
[im 11/15]
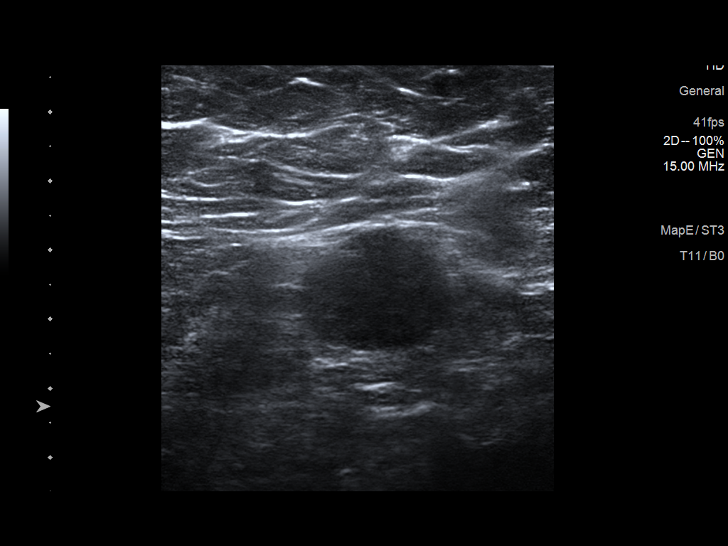
[im 13/15]
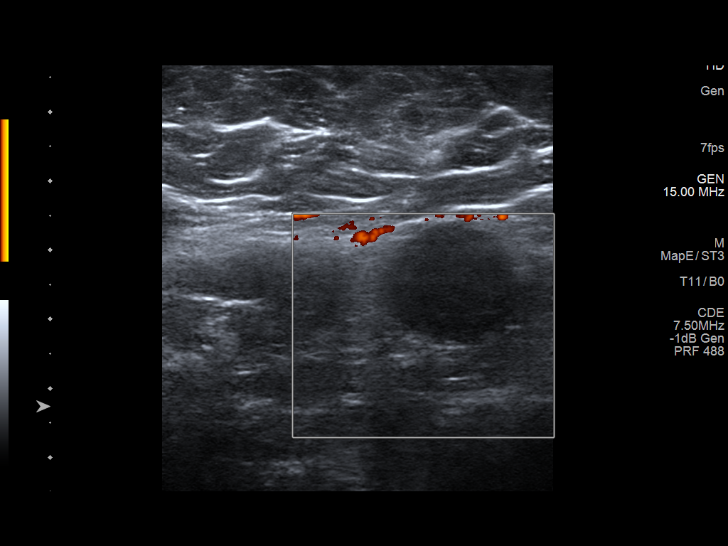
[im 14/15]
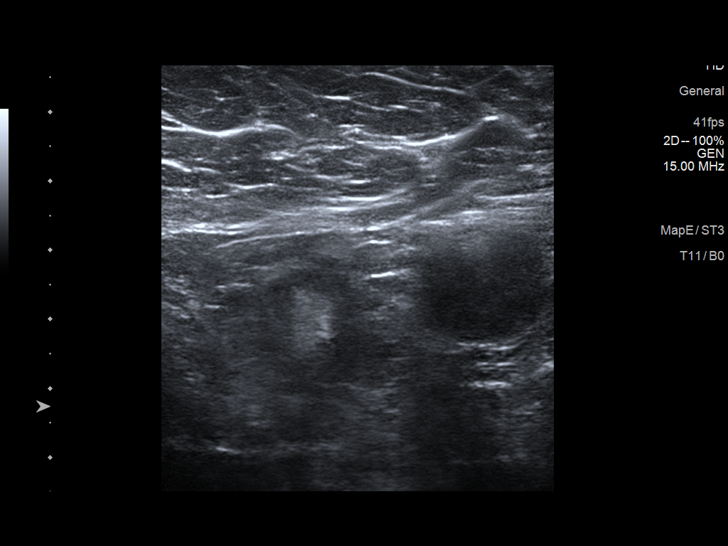
[im 15/15]
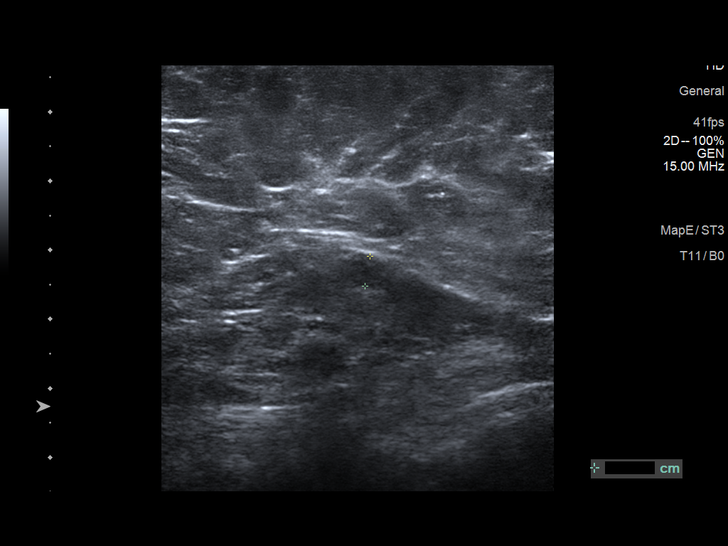

[13 of 15 positions shown; findings below may reference images not displayed]

ACR Breast Density Category b: There are scattered areas of
fibroglandular density.
FINDINGS: Examination demonstrates an irregular 2.1 cm mass over the slightly
the upper-outer right breast.

Targeted ultrasound is performed, showing a hypoechoic mass with
irregular shape and borders over the 10 o'clock position of the
right breast 3 cm from the nipple measuring 1.8 x 1.9 x 2.1 cm.

Ultrasound of the right axilla demonstrates 2 adjacent abnormal
lymph nodes 1 of which demonstrates complete replacement of the
fatty hilum measuring 2.4 cm with the adjacent no demonstrating
cortical thickening of 4-5 mm.
IMPRESSION: Suspicious 2.1 cm mass over the 10 o'clock position of the right
breast 3 cm from the nipple. Two adjacent abnormal right axillary
lymph nodes.

RECOMMENDATION:
Recommend ultrasound-guided core needle biopsy of this suspicious
mass and the fatty replaced right axillary lymph node.

I have discussed the findings and recommendations with the patient.
Results were also provided in writing at the conclusion of the
visit. If applicable, a reminder letter will be sent to the patient
regarding the next appointment.

BI-RADS CATEGORY  5: Highly suggestive of malignancy.

Patient will be scheduled for biopsy here at the [REDACTED] prior
to her departure.

## 2020-08-14 IMAGING — MG DIGITAL DIAGNOSTIC UNILATERAL RIGHT MAMMOGRAM WITH TOMO AND CAD
4 series · 4 of 12 positions shown · non-contrast
Comparison: Previous exam(s).

CLINICAL DATA: Patient presents for additional views of the right
breast as follow-up to recent screening exam to evaluate a possible
mass.

EXAM:
DIGITAL DIAGNOSTIC right MAMMOGRAM WITH TOMO
ULTRASOUND right BREAST

[R CC synth-2D]
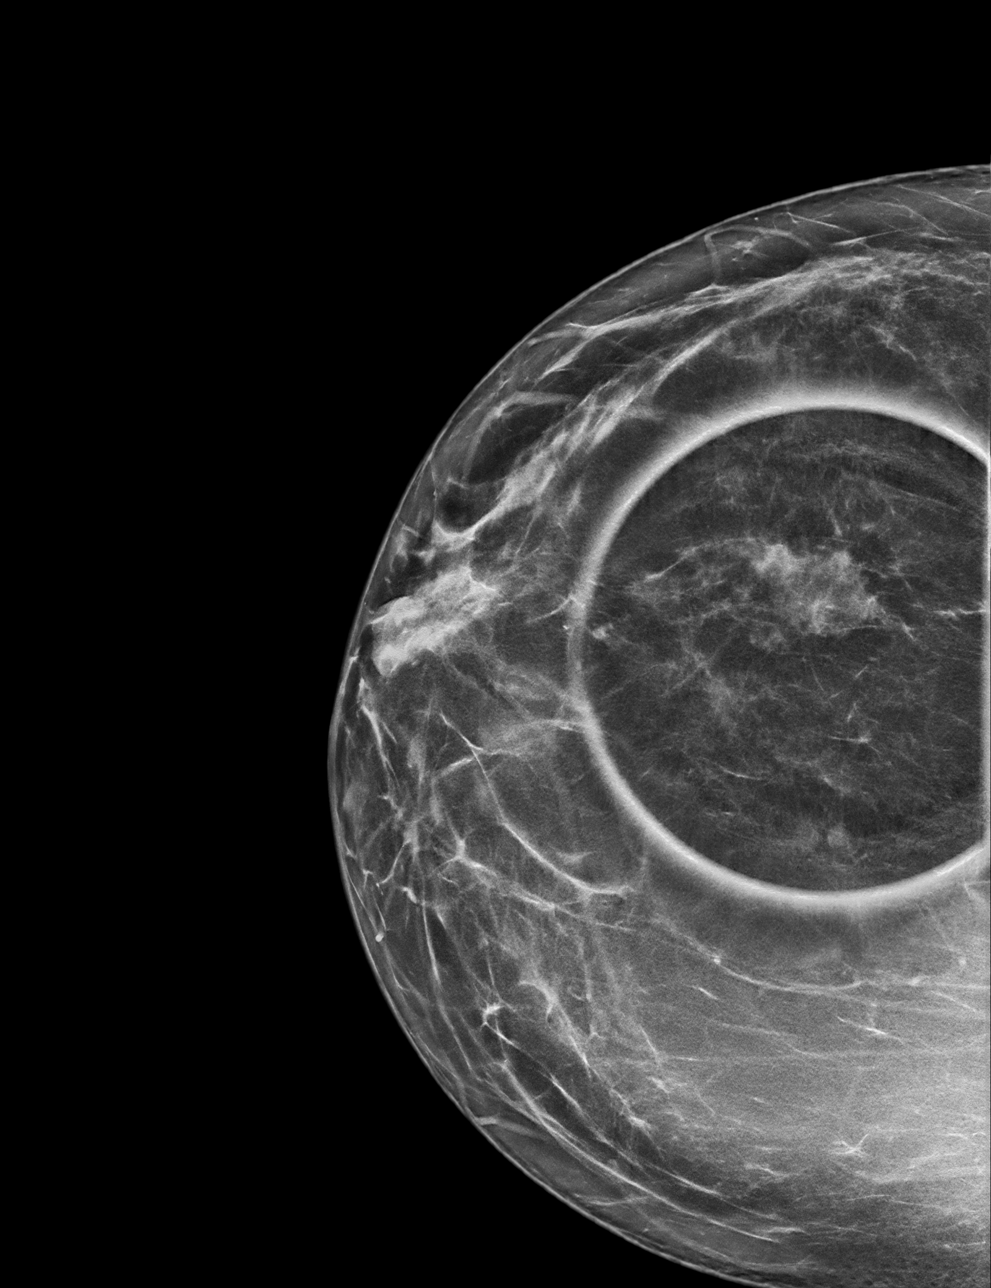

[R MLO synth-2D]
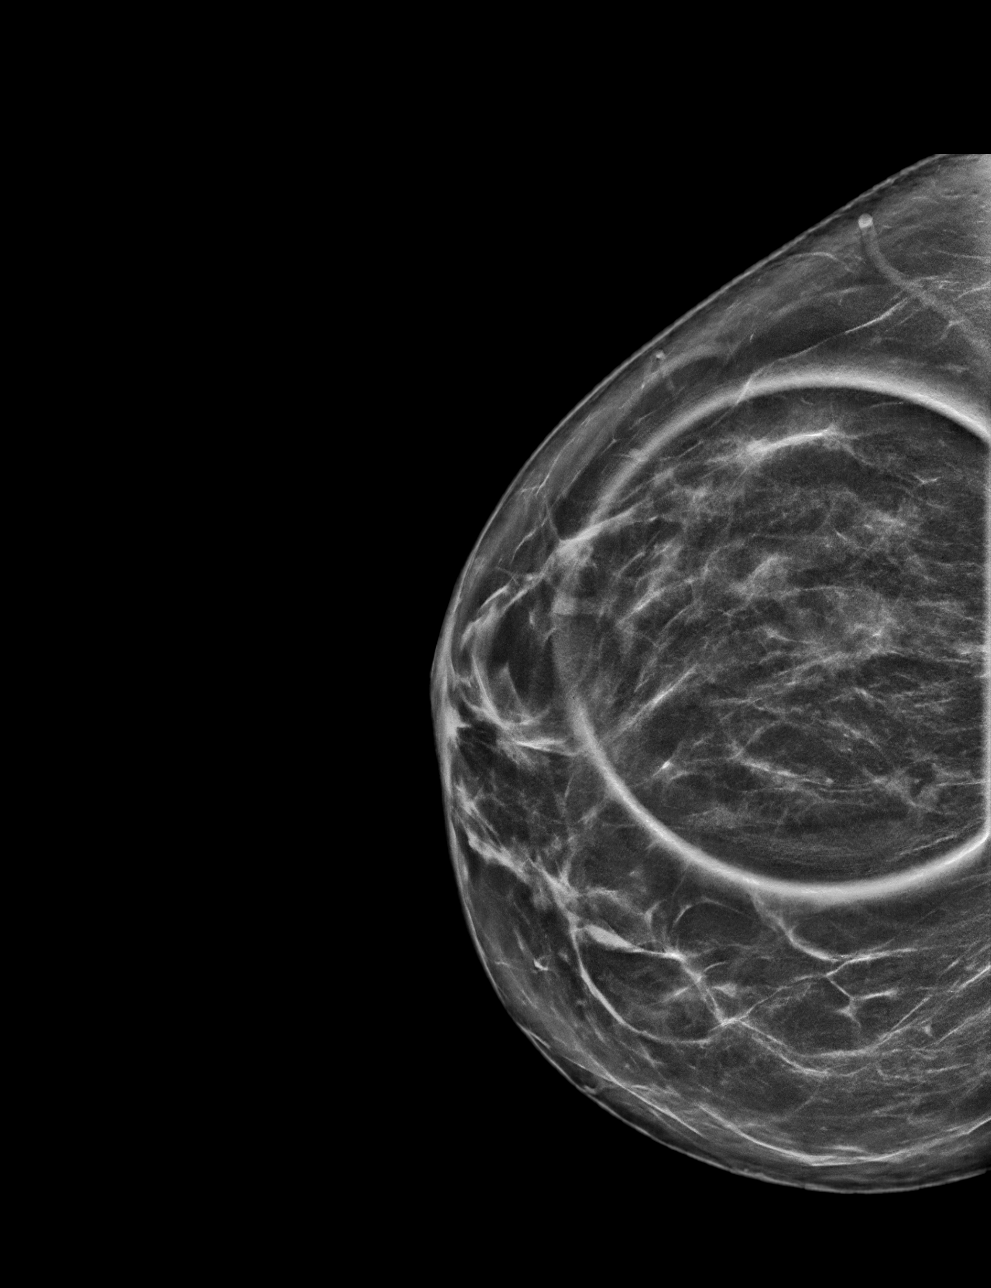

[R CC tomo · tomo slice 35/70.0]
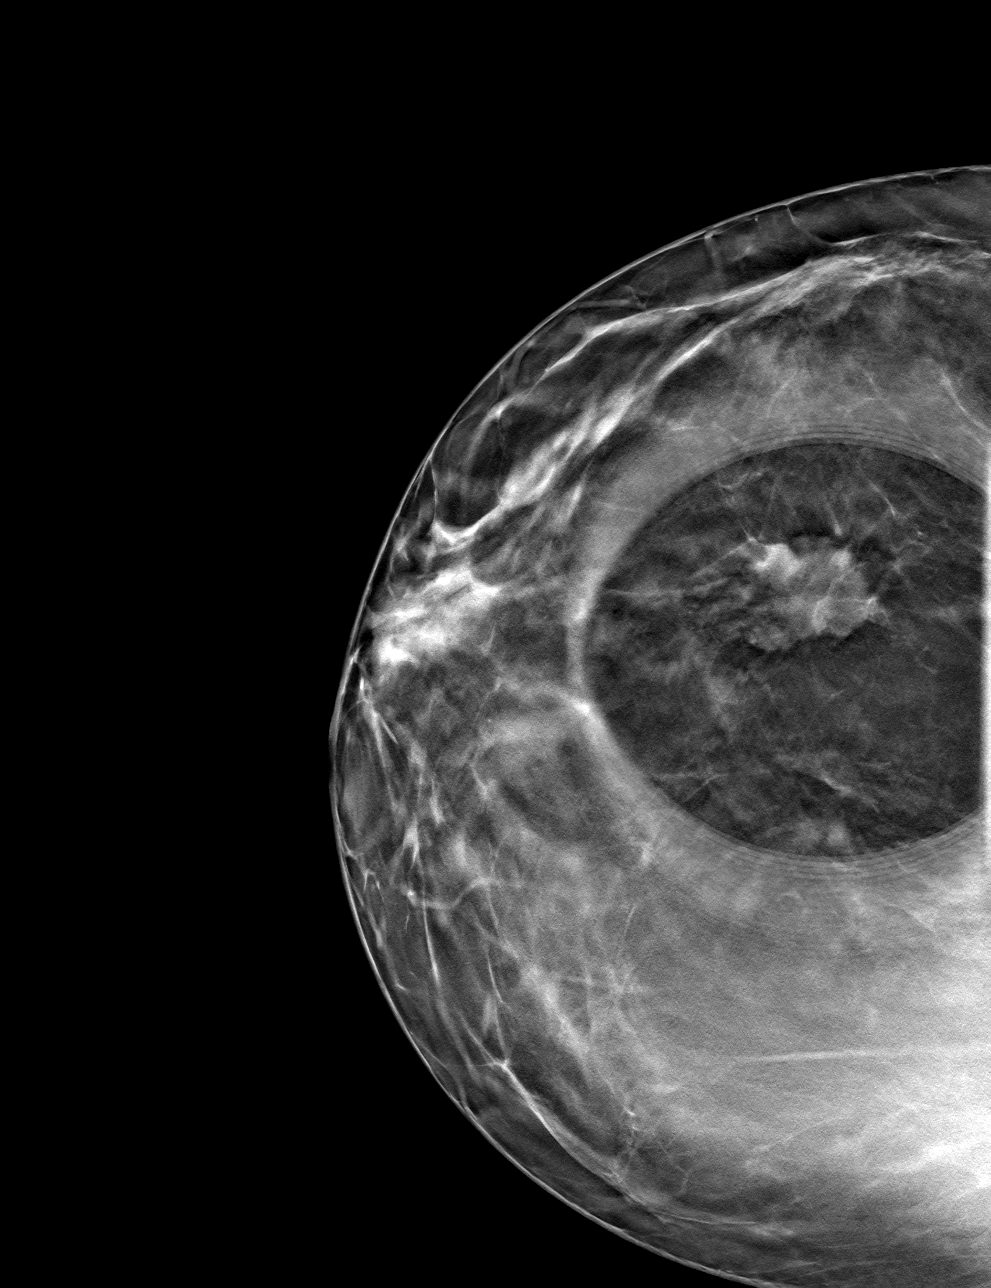

[R MLO tomo · tomo slice 45/89.0]
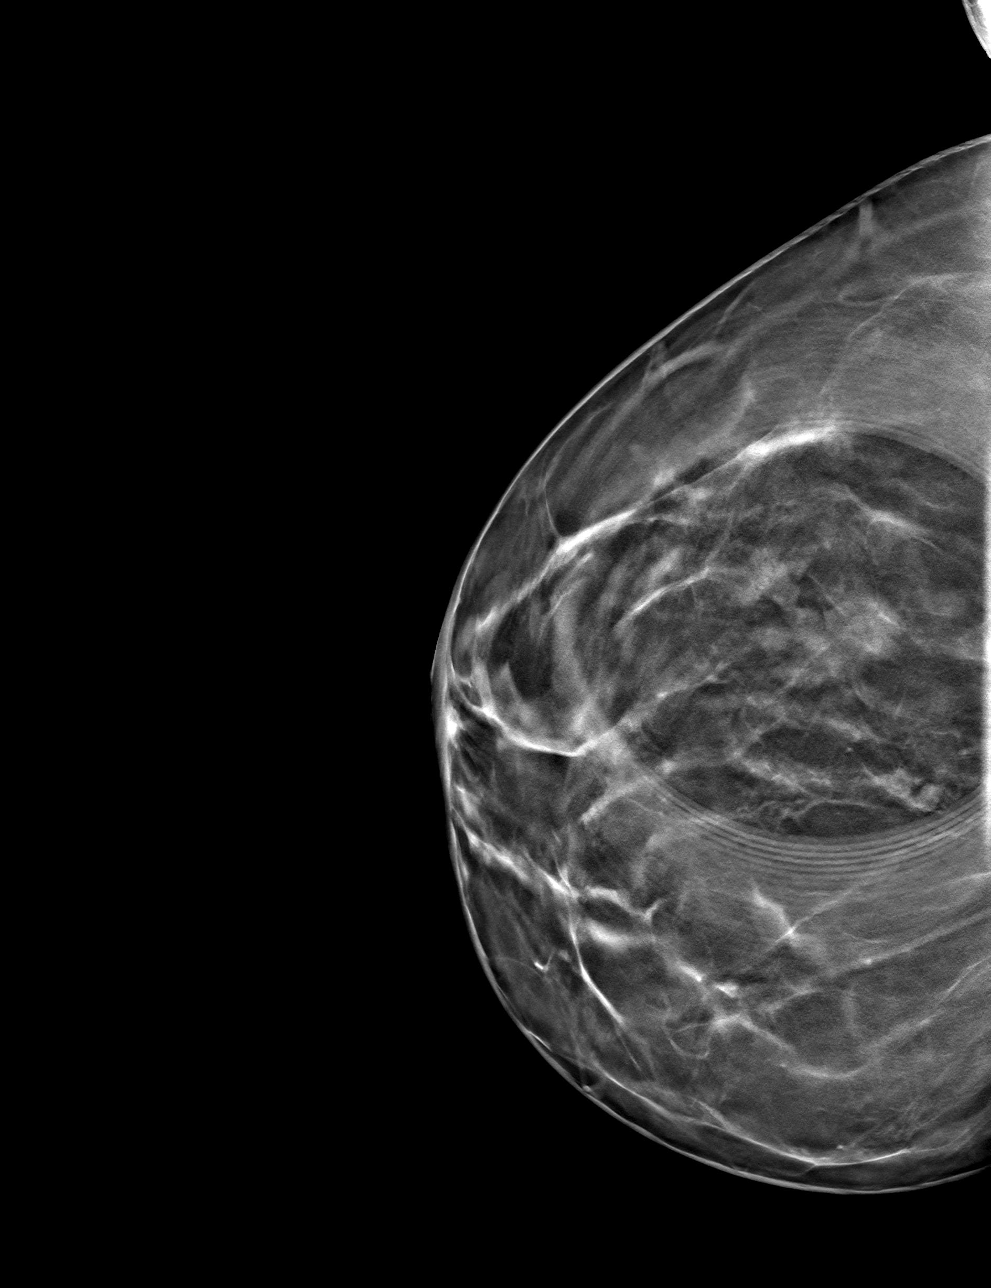

[4 of 12 positions shown; findings below may reference images not displayed]

ACR Breast Density Category b: There are scattered areas of
fibroglandular density.
FINDINGS: Examination demonstrates an irregular 2.1 cm mass over the slightly
the upper-outer right breast.

Targeted ultrasound is performed, showing a hypoechoic mass with
irregular shape and borders over the 10 o'clock position of the
right breast 3 cm from the nipple measuring 1.8 x 1.9 x 2.1 cm.

Ultrasound of the right axilla demonstrates 2 adjacent abnormal
lymph nodes 1 of which demonstrates complete replacement of the
fatty hilum measuring 2.4 cm with the adjacent no demonstrating
cortical thickening of 4-5 mm.
IMPRESSION: Suspicious 2.1 cm mass over the 10 o'clock position of the right
breast 3 cm from the nipple. Two adjacent abnormal right axillary
lymph nodes.

RECOMMENDATION:
Recommend ultrasound-guided core needle biopsy of this suspicious
mass and the fatty replaced right axillary lymph node.

I have discussed the findings and recommendations with the patient.
Results were also provided in writing at the conclusion of the
visit. If applicable, a reminder letter will be sent to the patient
regarding the next appointment.

BI-RADS CATEGORY  5: Highly suggestive of malignancy.

Patient will be scheduled for biopsy here at the [REDACTED] prior
to her departure.

## 2020-08-15 IMAGING — MG MM CLIP PLACEMENT
3 series · 3 of 3 positions shown · non-contrast
Comparison: Previous exam(s).

CLINICAL DATA: 42-year-old female status post ultrasound-guided
biopsy of the right breast and right axilla.

EXAM:
DIAGNOSTIC RIGHT MAMMOGRAM POST ULTRASOUND BIOPSY

[R MLO]
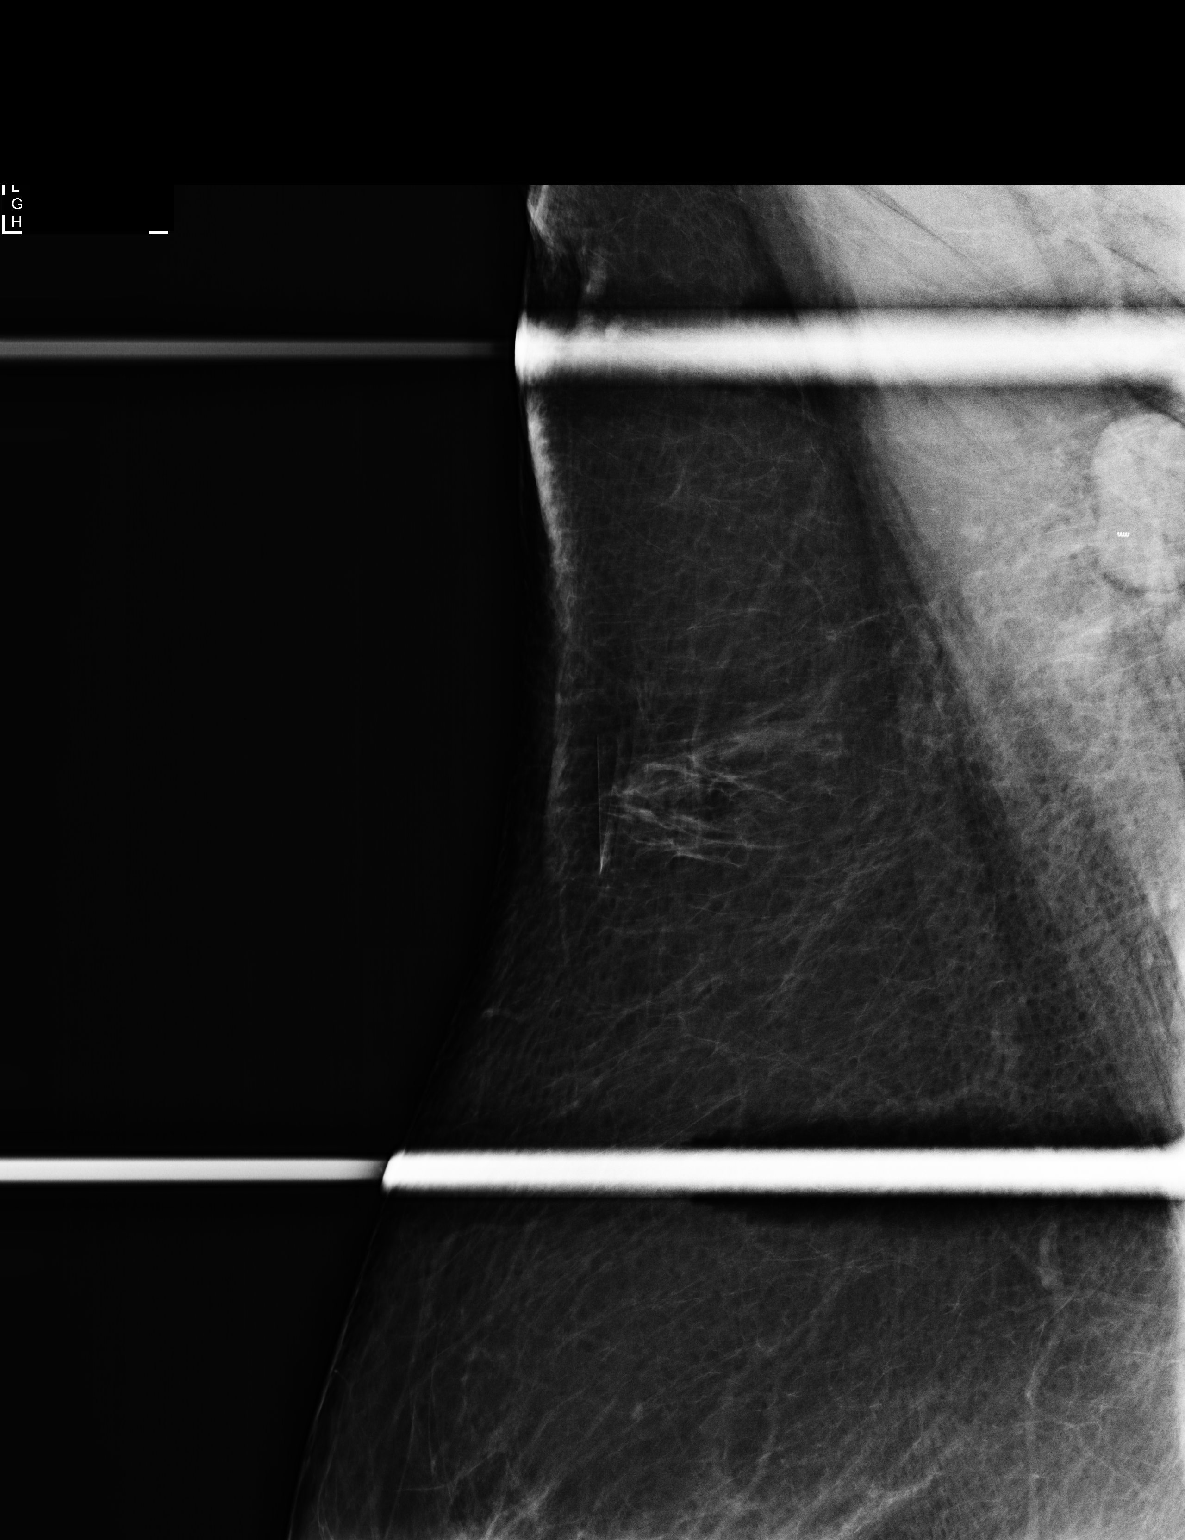

[R CC]
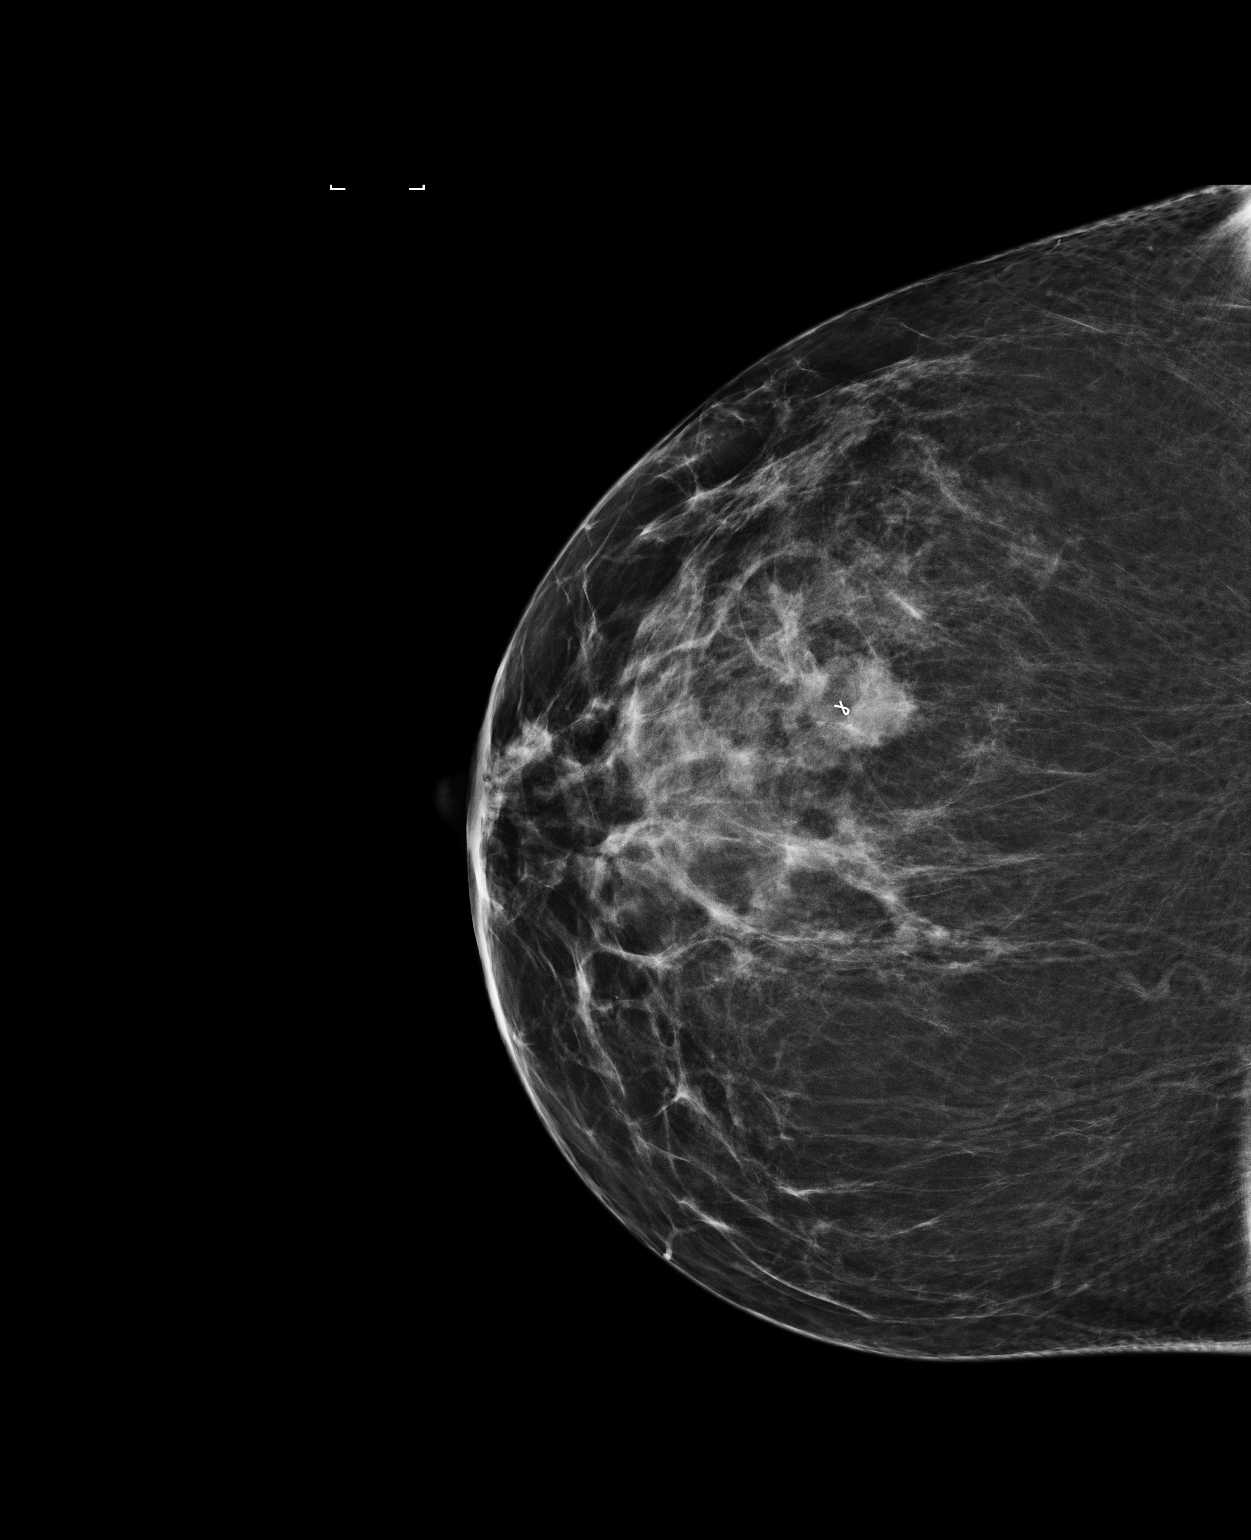

[R ML]
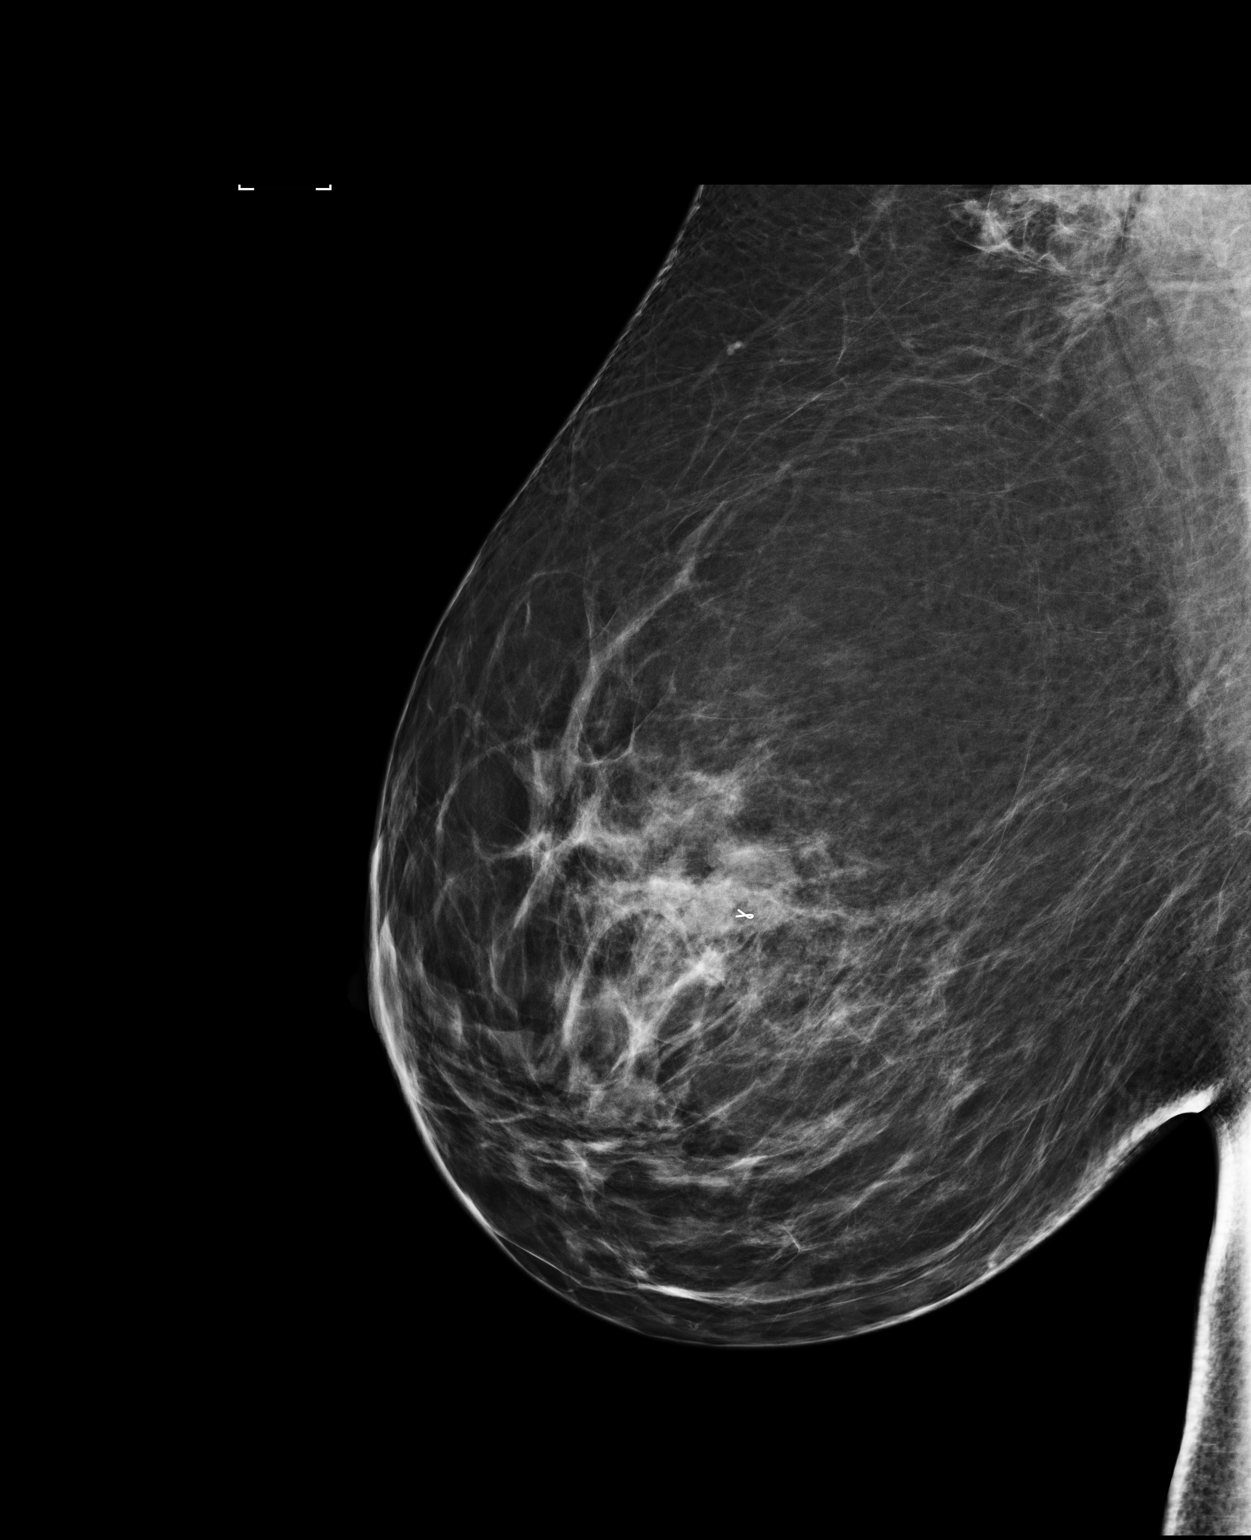

[3 of 3 positions shown; findings below may reference images not displayed]

FINDINGS: Mammographic images were obtained following ultrasound guided biopsy
of the right breast and axilla. Ribbon shaped clip is identified in
the upper outer right breast at the site of the previously
identified mass. HydroMARK clip is identified within an abnormal
right axillary lymph node.
IMPRESSION: Post biopsy clips in the expected locations status post
ultrasound-guided biopsy of the right breast and right axilla.

Final Assessment: Post Procedure Mammograms for Marker Placement

## 2020-08-17 DIAGNOSIS — Z923 Personal history of irradiation: Secondary | ICD-10-CM | POA: Diagnosis not present

## 2020-08-17 DIAGNOSIS — Z171 Estrogen receptor negative status [ER-]: Secondary | ICD-10-CM | POA: Diagnosis not present

## 2020-08-17 DIAGNOSIS — C50411 Malignant neoplasm of upper-outer quadrant of right female breast: Secondary | ICD-10-CM | POA: Diagnosis not present

## 2020-08-17 DIAGNOSIS — Z9221 Personal history of antineoplastic chemotherapy: Secondary | ICD-10-CM | POA: Diagnosis not present

## 2020-08-17 DIAGNOSIS — C50811 Malignant neoplasm of overlapping sites of right female breast: Secondary | ICD-10-CM | POA: Diagnosis not present

## 2020-08-17 DIAGNOSIS — Z23 Encounter for immunization: Secondary | ICD-10-CM | POA: Diagnosis not present

## 2020-11-11 DIAGNOSIS — Z808 Family history of malignant neoplasm of other organs or systems: Secondary | ICD-10-CM | POA: Diagnosis not present

## 2020-11-11 DIAGNOSIS — L570 Actinic keratosis: Secondary | ICD-10-CM | POA: Diagnosis not present

## 2020-11-11 DIAGNOSIS — L821 Other seborrheic keratosis: Secondary | ICD-10-CM | POA: Diagnosis not present

## 2020-11-11 DIAGNOSIS — L578 Other skin changes due to chronic exposure to nonionizing radiation: Secondary | ICD-10-CM | POA: Diagnosis not present

## 2020-11-16 DIAGNOSIS — Z171 Estrogen receptor negative status [ER-]: Secondary | ICD-10-CM | POA: Diagnosis not present

## 2020-11-16 DIAGNOSIS — C50811 Malignant neoplasm of overlapping sites of right female breast: Secondary | ICD-10-CM | POA: Diagnosis not present

## 2020-11-19 DIAGNOSIS — Z171 Estrogen receptor negative status [ER-]: Secondary | ICD-10-CM | POA: Diagnosis not present

## 2020-11-19 DIAGNOSIS — C50811 Malignant neoplasm of overlapping sites of right female breast: Secondary | ICD-10-CM | POA: Diagnosis not present

## 2020-11-23 DIAGNOSIS — Z01419 Encounter for gynecological examination (general) (routine) without abnormal findings: Secondary | ICD-10-CM | POA: Diagnosis not present

## 2020-11-23 DIAGNOSIS — Z6834 Body mass index (BMI) 34.0-34.9, adult: Secondary | ICD-10-CM | POA: Diagnosis not present

## 2021-01-31 DIAGNOSIS — R42 Dizziness and giddiness: Secondary | ICD-10-CM | POA: Diagnosis not present

## 2021-01-31 DIAGNOSIS — I771 Stricture of artery: Secondary | ICD-10-CM | POA: Diagnosis not present

## 2021-03-07 DIAGNOSIS — Z08 Encounter for follow-up examination after completed treatment for malignant neoplasm: Secondary | ICD-10-CM | POA: Diagnosis not present

## 2021-03-07 DIAGNOSIS — Z853 Personal history of malignant neoplasm of breast: Secondary | ICD-10-CM | POA: Diagnosis not present

## 2021-03-07 DIAGNOSIS — Z006 Encounter for examination for normal comparison and control in clinical research program: Secondary | ICD-10-CM | POA: Diagnosis not present

## 2021-03-30 DIAGNOSIS — Z171 Estrogen receptor negative status [ER-]: Secondary | ICD-10-CM | POA: Diagnosis not present

## 2021-03-30 DIAGNOSIS — I890 Lymphedema, not elsewhere classified: Secondary | ICD-10-CM | POA: Diagnosis not present

## 2021-03-30 DIAGNOSIS — C50811 Malignant neoplasm of overlapping sites of right female breast: Secondary | ICD-10-CM | POA: Diagnosis not present

## 2021-05-30 DIAGNOSIS — I890 Lymphedema, not elsewhere classified: Secondary | ICD-10-CM | POA: Diagnosis not present

## 2021-05-30 DIAGNOSIS — Z885 Allergy status to narcotic agent status: Secondary | ICD-10-CM | POA: Diagnosis not present

## 2021-05-30 DIAGNOSIS — Z9221 Personal history of antineoplastic chemotherapy: Secondary | ICD-10-CM | POA: Diagnosis not present

## 2021-05-30 DIAGNOSIS — C50811 Malignant neoplasm of overlapping sites of right female breast: Secondary | ICD-10-CM | POA: Diagnosis not present

## 2021-05-30 DIAGNOSIS — Z171 Estrogen receptor negative status [ER-]: Secondary | ICD-10-CM | POA: Diagnosis not present

## 2021-05-30 DIAGNOSIS — Z9889 Other specified postprocedural states: Secondary | ICD-10-CM | POA: Diagnosis not present

## 2021-05-30 DIAGNOSIS — Z923 Personal history of irradiation: Secondary | ICD-10-CM | POA: Diagnosis not present

## 2021-05-30 DIAGNOSIS — Z881 Allergy status to other antibiotic agents status: Secondary | ICD-10-CM | POA: Diagnosis not present

## 2021-05-30 DIAGNOSIS — C773 Secondary and unspecified malignant neoplasm of axilla and upper limb lymph nodes: Secondary | ICD-10-CM | POA: Diagnosis not present

## 2021-05-30 DIAGNOSIS — G43109 Migraine with aura, not intractable, without status migrainosus: Secondary | ICD-10-CM | POA: Diagnosis not present

## 2021-05-30 DIAGNOSIS — G43909 Migraine, unspecified, not intractable, without status migrainosus: Secondary | ICD-10-CM | POA: Diagnosis not present

## 2021-05-30 DIAGNOSIS — C50911 Malignant neoplasm of unspecified site of right female breast: Secondary | ICD-10-CM | POA: Diagnosis not present

## 2021-05-30 DIAGNOSIS — M792 Neuralgia and neuritis, unspecified: Secondary | ICD-10-CM | POA: Diagnosis not present

## 2021-05-30 DIAGNOSIS — Z882 Allergy status to sulfonamides status: Secondary | ICD-10-CM | POA: Diagnosis not present

## 2021-05-30 DIAGNOSIS — Z853 Personal history of malignant neoplasm of breast: Secondary | ICD-10-CM | POA: Diagnosis not present

## 2021-05-30 DIAGNOSIS — M25441 Effusion, right hand: Secondary | ICD-10-CM | POA: Diagnosis not present

## 2021-05-30 DIAGNOSIS — Z08 Encounter for follow-up examination after completed treatment for malignant neoplasm: Secondary | ICD-10-CM | POA: Diagnosis not present

## 2021-08-18 DIAGNOSIS — M722 Plantar fascial fibromatosis: Secondary | ICD-10-CM | POA: Diagnosis not present

## 2021-08-18 DIAGNOSIS — M79671 Pain in right foot: Secondary | ICD-10-CM | POA: Diagnosis not present

## 2021-08-19 DIAGNOSIS — M722 Plantar fascial fibromatosis: Secondary | ICD-10-CM | POA: Diagnosis not present

## 2021-08-22 DIAGNOSIS — Z171 Estrogen receptor negative status [ER-]: Secondary | ICD-10-CM | POA: Diagnosis not present

## 2021-08-22 DIAGNOSIS — C773 Secondary and unspecified malignant neoplasm of axilla and upper limb lymph nodes: Secondary | ICD-10-CM | POA: Diagnosis not present

## 2021-08-22 DIAGNOSIS — I890 Lymphedema, not elsewhere classified: Secondary | ICD-10-CM | POA: Diagnosis not present

## 2021-08-22 DIAGNOSIS — G43909 Migraine, unspecified, not intractable, without status migrainosus: Secondary | ICD-10-CM | POA: Diagnosis not present

## 2021-08-22 DIAGNOSIS — C50111 Malignant neoplasm of central portion of right female breast: Secondary | ICD-10-CM | POA: Diagnosis not present

## 2021-11-13 HISTORY — PX: OTHER SURGICAL HISTORY: SHX169

## 2021-12-06 DIAGNOSIS — E039 Hypothyroidism, unspecified: Secondary | ICD-10-CM | POA: Diagnosis not present

## 2021-12-06 DIAGNOSIS — Z01419 Encounter for gynecological examination (general) (routine) without abnormal findings: Secondary | ICD-10-CM | POA: Diagnosis not present

## 2021-12-06 DIAGNOSIS — Z6833 Body mass index (BMI) 33.0-33.9, adult: Secondary | ICD-10-CM | POA: Diagnosis not present

## 2021-12-12 DIAGNOSIS — Z923 Personal history of irradiation: Secondary | ICD-10-CM | POA: Diagnosis not present

## 2021-12-12 DIAGNOSIS — Z9013 Acquired absence of bilateral breasts and nipples: Secondary | ICD-10-CM | POA: Diagnosis not present

## 2021-12-12 DIAGNOSIS — Z08 Encounter for follow-up examination after completed treatment for malignant neoplasm: Secondary | ICD-10-CM | POA: Diagnosis not present

## 2021-12-12 DIAGNOSIS — Z171 Estrogen receptor negative status [ER-]: Secondary | ICD-10-CM | POA: Diagnosis not present

## 2021-12-12 DIAGNOSIS — C50911 Malignant neoplasm of unspecified site of right female breast: Secondary | ICD-10-CM | POA: Diagnosis not present

## 2021-12-12 DIAGNOSIS — Z853 Personal history of malignant neoplasm of breast: Secondary | ICD-10-CM | POA: Diagnosis not present

## 2021-12-12 DIAGNOSIS — C773 Secondary and unspecified malignant neoplasm of axilla and upper limb lymph nodes: Secondary | ICD-10-CM | POA: Diagnosis not present

## 2021-12-12 DIAGNOSIS — Z9221 Personal history of antineoplastic chemotherapy: Secondary | ICD-10-CM | POA: Diagnosis not present

## 2022-01-19 DIAGNOSIS — E039 Hypothyroidism, unspecified: Secondary | ICD-10-CM | POA: Diagnosis not present

## 2022-01-30 DIAGNOSIS — L578 Other skin changes due to chronic exposure to nonionizing radiation: Secondary | ICD-10-CM | POA: Diagnosis not present

## 2022-01-30 DIAGNOSIS — L570 Actinic keratosis: Secondary | ICD-10-CM | POA: Diagnosis not present

## 2022-01-30 DIAGNOSIS — L814 Other melanin hyperpigmentation: Secondary | ICD-10-CM | POA: Diagnosis not present

## 2022-01-30 DIAGNOSIS — Z808 Family history of malignant neoplasm of other organs or systems: Secondary | ICD-10-CM | POA: Diagnosis not present

## 2022-02-03 DIAGNOSIS — R112 Nausea with vomiting, unspecified: Secondary | ICD-10-CM | POA: Diagnosis not present

## 2022-02-03 DIAGNOSIS — K529 Noninfective gastroenteritis and colitis, unspecified: Secondary | ICD-10-CM | POA: Diagnosis not present

## 2022-02-03 DIAGNOSIS — R197 Diarrhea, unspecified: Secondary | ICD-10-CM | POA: Diagnosis not present

## 2022-02-13 DIAGNOSIS — C50111 Malignant neoplasm of central portion of right female breast: Secondary | ICD-10-CM | POA: Diagnosis not present

## 2022-02-13 DIAGNOSIS — C50911 Malignant neoplasm of unspecified site of right female breast: Secondary | ICD-10-CM | POA: Diagnosis not present

## 2022-02-13 DIAGNOSIS — C773 Secondary and unspecified malignant neoplasm of axilla and upper limb lymph nodes: Secondary | ICD-10-CM | POA: Diagnosis not present

## 2022-02-13 DIAGNOSIS — Z923 Personal history of irradiation: Secondary | ICD-10-CM | POA: Diagnosis not present

## 2022-02-16 DIAGNOSIS — R519 Headache, unspecified: Secondary | ICD-10-CM | POA: Diagnosis not present

## 2022-02-16 DIAGNOSIS — B009 Herpesviral infection, unspecified: Secondary | ICD-10-CM | POA: Diagnosis not present

## 2022-02-20 ENCOUNTER — Ambulatory Visit (INDEPENDENT_AMBULATORY_CARE_PROVIDER_SITE_OTHER): Payer: BC Managed Care – PPO | Admitting: Sports Medicine

## 2022-02-20 DIAGNOSIS — K121 Other forms of stomatitis: Secondary | ICD-10-CM

## 2022-02-20 MED ORDER — PREDNISONE 50 MG PO TABS: ORAL_TABLET | ORAL | 0 refills | Status: DC

## 2022-02-20 MED ORDER — TRIAMCINOLONE ACETONIDE 0.1 % MT PSTE: 1.0000 "application " | PASTE | Freq: Two times a day (BID) | OROMUCOSAL | 11 refills | Status: AC

## 2022-02-20 NOTE — Progress Notes (Signed)
? ? ?  Procedures performed today:   ? ?None. ? ?Independent interpretation of notes and tests performed by another provider:  ? ?None. ? ?Brief History, Exam, Impression, and Recommendations:   ? ?HSV Stomatitis ?Pleasant 46 year old female, got a cold sore and went to urgent care, was treated aggressively with Valtrex 2000 mg twice daily for 7 days, symptoms improved slightly. ?Due to persistent symptoms and the appearance we will treat her aggressively with prednisone, topical triamcinolone. ?I have also recommended that she get some Abreva. ?External ear canals are unremarkable indicating no sign of Ramsay Hunt syndrome. ?She will get Shingrix at 68. ?She can return to see [place] as needed. ? ? ? ?___________________________________________ ?Gwen Her. Dianah Field, M.D., ABFM., CAQSM. ?Primary Care and Sports Medicine ?[facility] ? ?Adjunct Instructor of Family Medicine  ?[employer] ?

## 2022-02-20 NOTE — Assessment & Plan Note (Signed)
Pleasant 46 year old female, got a cold sore and went to urgent care, was treated aggressively with Valtrex 2000 mg twice daily for 7 days, symptoms improved slightly. ?Due to persistent symptoms and the appearance we will treat her aggressively with prednisone, topical triamcinolone. ?I have also recommended that she get some Abreva. ?External ear canals are unremarkable indicating no sign of Ramsay Hunt syndrome. ?She will get Shingrix at 67. ?She can return to see [place] as needed. ? ?

## 2022-02-23 DIAGNOSIS — Z923 Personal history of irradiation: Secondary | ICD-10-CM | POA: Diagnosis not present

## 2022-02-23 DIAGNOSIS — C50911 Malignant neoplasm of unspecified site of right female breast: Secondary | ICD-10-CM | POA: Diagnosis not present

## 2022-02-23 DIAGNOSIS — Z171 Estrogen receptor negative status [ER-]: Secondary | ICD-10-CM | POA: Diagnosis not present

## 2022-02-23 DIAGNOSIS — C773 Secondary and unspecified malignant neoplasm of axilla and upper limb lymph nodes: Secondary | ICD-10-CM | POA: Diagnosis not present

## 2022-02-23 DIAGNOSIS — C50111 Malignant neoplasm of central portion of right female breast: Secondary | ICD-10-CM | POA: Diagnosis not present

## 2022-02-24 DIAGNOSIS — J0100 Acute maxillary sinusitis, unspecified: Secondary | ICD-10-CM | POA: Diagnosis not present

## 2022-03-02 DIAGNOSIS — Z171 Estrogen receptor negative status [ER-]: Secondary | ICD-10-CM | POA: Diagnosis not present

## 2022-03-02 DIAGNOSIS — C50911 Malignant neoplasm of unspecified site of right female breast: Secondary | ICD-10-CM | POA: Diagnosis not present

## 2022-03-02 DIAGNOSIS — I890 Lymphedema, not elsewhere classified: Secondary | ICD-10-CM | POA: Diagnosis not present

## 2022-04-03 DIAGNOSIS — C50811 Malignant neoplasm of overlapping sites of right female breast: Secondary | ICD-10-CM | POA: Diagnosis not present

## 2022-04-03 DIAGNOSIS — Z171 Estrogen receptor negative status [ER-]: Secondary | ICD-10-CM | POA: Diagnosis not present

## 2022-04-03 DIAGNOSIS — C773 Secondary and unspecified malignant neoplasm of axilla and upper limb lymph nodes: Secondary | ICD-10-CM | POA: Diagnosis not present

## 2022-04-03 DIAGNOSIS — Z9221 Personal history of antineoplastic chemotherapy: Secondary | ICD-10-CM | POA: Diagnosis not present

## 2022-04-03 DIAGNOSIS — C50911 Malignant neoplasm of unspecified site of right female breast: Secondary | ICD-10-CM | POA: Diagnosis not present

## 2022-05-05 ENCOUNTER — Encounter: Payer: Self-pay | Admitting: Family Medicine

## 2022-05-29 DIAGNOSIS — E039 Hypothyroidism, unspecified: Secondary | ICD-10-CM | POA: Diagnosis not present

## 2022-05-29 DIAGNOSIS — C50911 Malignant neoplasm of unspecified site of right female breast: Secondary | ICD-10-CM | POA: Diagnosis not present

## 2022-05-29 DIAGNOSIS — Z9013 Acquired absence of bilateral breasts and nipples: Secondary | ICD-10-CM | POA: Diagnosis not present

## 2022-05-29 DIAGNOSIS — Z923 Personal history of irradiation: Secondary | ICD-10-CM | POA: Diagnosis not present

## 2022-05-29 DIAGNOSIS — Z9221 Personal history of antineoplastic chemotherapy: Secondary | ICD-10-CM | POA: Diagnosis not present

## 2022-05-29 DIAGNOSIS — Z853 Personal history of malignant neoplasm of breast: Secondary | ICD-10-CM | POA: Diagnosis not present

## 2022-05-29 DIAGNOSIS — Z171 Estrogen receptor negative status [ER-]: Secondary | ICD-10-CM | POA: Diagnosis not present

## 2022-05-29 DIAGNOSIS — C773 Secondary and unspecified malignant neoplasm of axilla and upper limb lymph nodes: Secondary | ICD-10-CM | POA: Diagnosis not present

## 2022-05-29 DIAGNOSIS — K219 Gastro-esophageal reflux disease without esophagitis: Secondary | ICD-10-CM | POA: Diagnosis not present

## 2022-06-08 DIAGNOSIS — Z923 Personal history of irradiation: Secondary | ICD-10-CM | POA: Diagnosis not present

## 2022-06-08 DIAGNOSIS — G8918 Other acute postprocedural pain: Secondary | ICD-10-CM | POA: Diagnosis not present

## 2022-06-08 DIAGNOSIS — L7682 Other postprocedural complications of skin and subcutaneous tissue: Secondary | ICD-10-CM | POA: Diagnosis not present

## 2022-06-08 DIAGNOSIS — Z808 Family history of malignant neoplasm of other organs or systems: Secondary | ICD-10-CM | POA: Diagnosis not present

## 2022-06-08 DIAGNOSIS — Z171 Estrogen receptor negative status [ER-]: Secondary | ICD-10-CM | POA: Diagnosis not present

## 2022-06-08 DIAGNOSIS — Z800 Family history of malignant neoplasm of digestive organs: Secondary | ICD-10-CM | POA: Diagnosis not present

## 2022-06-08 DIAGNOSIS — Z9221 Personal history of antineoplastic chemotherapy: Secondary | ICD-10-CM | POA: Diagnosis not present

## 2022-06-08 DIAGNOSIS — Z9013 Acquired absence of bilateral breasts and nipples: Secondary | ICD-10-CM | POA: Diagnosis not present

## 2022-06-08 DIAGNOSIS — N61 Mastitis without abscess: Secondary | ICD-10-CM | POA: Diagnosis not present

## 2022-06-08 DIAGNOSIS — Z803 Family history of malignant neoplasm of breast: Secondary | ICD-10-CM | POA: Diagnosis not present

## 2022-06-08 DIAGNOSIS — Y834 Other reconstructive surgery as the cause of abnormal reaction of the patient, or of later complication, without mention of misadventure at the time of the procedure: Secondary | ICD-10-CM | POA: Diagnosis not present

## 2022-06-08 DIAGNOSIS — Z421 Encounter for breast reconstruction following mastectomy: Secondary | ICD-10-CM | POA: Diagnosis not present

## 2022-06-08 DIAGNOSIS — F32A Depression, unspecified: Secondary | ICD-10-CM | POA: Diagnosis not present

## 2022-06-08 DIAGNOSIS — L7632 Postprocedural hematoma of skin and subcutaneous tissue following other procedure: Secondary | ICD-10-CM | POA: Diagnosis not present

## 2022-06-08 DIAGNOSIS — Z853 Personal history of malignant neoplasm of breast: Secondary | ICD-10-CM | POA: Diagnosis not present

## 2022-06-08 DIAGNOSIS — R6889 Other general symptoms and signs: Secondary | ICD-10-CM | POA: Diagnosis not present

## 2022-06-08 DIAGNOSIS — I871 Compression of vein: Secondary | ICD-10-CM | POA: Diagnosis not present

## 2022-06-08 DIAGNOSIS — N641 Fat necrosis of breast: Secondary | ICD-10-CM | POA: Diagnosis not present

## 2022-06-08 DIAGNOSIS — E039 Hypothyroidism, unspecified: Secondary | ICD-10-CM | POA: Diagnosis not present

## 2022-06-08 DIAGNOSIS — R222 Localized swelling, mass and lump, trunk: Secondary | ICD-10-CM | POA: Diagnosis not present

## 2022-06-08 DIAGNOSIS — G43909 Migraine, unspecified, not intractable, without status migrainosus: Secondary | ICD-10-CM | POA: Diagnosis not present

## 2022-06-08 DIAGNOSIS — K219 Gastro-esophageal reflux disease without esophagitis: Secondary | ICD-10-CM | POA: Diagnosis not present

## 2022-06-08 DIAGNOSIS — T8172XA Complication of vein following a procedure, not elsewhere classified, initial encounter: Secondary | ICD-10-CM | POA: Diagnosis not present

## 2022-06-11 DIAGNOSIS — R6889 Other general symptoms and signs: Secondary | ICD-10-CM | POA: Diagnosis not present

## 2022-09-04 DIAGNOSIS — C50811 Malignant neoplasm of overlapping sites of right female breast: Secondary | ICD-10-CM | POA: Diagnosis not present

## 2022-09-04 DIAGNOSIS — Z9889 Other specified postprocedural states: Secondary | ICD-10-CM | POA: Diagnosis not present

## 2022-09-04 DIAGNOSIS — C50911 Malignant neoplasm of unspecified site of right female breast: Secondary | ICD-10-CM | POA: Diagnosis not present

## 2022-09-04 DIAGNOSIS — Z483 Aftercare following surgery for neoplasm: Secondary | ICD-10-CM | POA: Diagnosis not present

## 2022-09-04 DIAGNOSIS — Z171 Estrogen receptor negative status [ER-]: Secondary | ICD-10-CM | POA: Diagnosis not present

## 2022-09-04 DIAGNOSIS — C773 Secondary and unspecified malignant neoplasm of axilla and upper limb lymph nodes: Secondary | ICD-10-CM | POA: Diagnosis not present

## 2022-09-04 DIAGNOSIS — C50111 Malignant neoplasm of central portion of right female breast: Secondary | ICD-10-CM | POA: Diagnosis not present

## 2023-03-22 ENCOUNTER — Encounter: Payer: Self-pay | Admitting: Gastroenterology

## 2023-04-10 ENCOUNTER — Telehealth: Payer: Self-pay

## 2023-04-10 ENCOUNTER — Ambulatory Visit (AMBULATORY_SURGERY_CENTER): Payer: BC Managed Care – PPO

## 2023-04-10 VITALS — Ht 62.0 in | Wt 160.0 lb

## 2023-04-10 DIAGNOSIS — Z1211 Encounter for screening for malignant neoplasm of colon: Secondary | ICD-10-CM

## 2023-04-10 MED ORDER — NA SULFATE-K SULFATE-MG SULF 17.5-3.13-1.6 GM/177ML PO SOLN
1.0000 | Freq: Once | ORAL | 0 refills | Status: AC
Start: 2023-04-10 — End: 2023-04-10

## 2023-04-10 NOTE — Telephone Encounter (Signed)
Called twice and left message both times reminding her of the pre visit with the nurse.  Left message stating that we needed to hear from her today by 5pm

## 2023-04-10 NOTE — Progress Notes (Signed)

## 2023-04-10 NOTE — Telephone Encounter (Signed)
Reached patient and completed pre visit. Mailed Prep instructions

## 2023-05-08 ENCOUNTER — Encounter: Payer: BC Managed Care – PPO | Admitting: Gastroenterology

## 2023-06-08 ENCOUNTER — Encounter: Payer: BC Managed Care – PPO | Admitting: Gastroenterology

## 2023-08-15 ENCOUNTER — Encounter: Payer: BC Managed Care – PPO | Admitting: Gastroenterology

## 2023-09-28 ENCOUNTER — Encounter: Payer: Self-pay | Admitting: Gastroenterology

## 2023-10-24 ENCOUNTER — Ambulatory Visit (AMBULATORY_SURGERY_CENTER): Payer: BC Managed Care – PPO

## 2023-10-24 VITALS — Ht 62.0 in | Wt 148.0 lb

## 2023-10-24 DIAGNOSIS — Z1211 Encounter for screening for malignant neoplasm of colon: Secondary | ICD-10-CM

## 2023-10-24 MED ORDER — NA SULFATE-K SULFATE-MG SULF 17.5-3.13-1.6 GM/177ML PO SOLN
1.0000 | Freq: Once | ORAL | 0 refills | Status: AC
Start: 2023-10-24 — End: 2023-10-24

## 2023-10-24 NOTE — Progress Notes (Signed)
No egg or soy allergy known to patient  No issues known to pt with past sedation with any surgeries or procedures Patient denies ever being told they had issues or difficulty with intubation  No FH of Malignant Hyperthermia Pt is not on diet pills Pt is not on  home 02  Pt is not on blood thinners  Pt denies issues with constipation  No A fib or A flutter Have any cardiac testing pending--no  LOA: independent  Prep: suprep   Patient's chart reviewed by Cathlyn Parsons CNRA prior to previsit and patient appropriate for the [facility].  Previsit completed and red dot placed by patient's name on their procedure day (on provider's schedule).     PV competed with patient. Prep instructions sent via mychart and home address. Goodrx coupon for provided to use for price reduction if needed.

## 2023-10-30 ENCOUNTER — Encounter: Payer: BC Managed Care – PPO | Admitting: Gastroenterology

## 2023-11-20 ENCOUNTER — Encounter: Payer: Self-pay | Admitting: Gastroenterology

## 2023-11-20 ENCOUNTER — Ambulatory Visit: Payer: BC Managed Care – PPO | Admitting: Gastroenterology

## 2023-11-20 VITALS — BP 116/79 | HR 79 | Temp 97.9°F | Resp 11 | Ht 62.0 in | Wt 148.0 lb

## 2023-11-20 DIAGNOSIS — K648 Other hemorrhoids: Secondary | ICD-10-CM | POA: Diagnosis not present

## 2023-11-20 DIAGNOSIS — K641 Second degree hemorrhoids: Secondary | ICD-10-CM

## 2023-11-20 DIAGNOSIS — K5730 Diverticulosis of large intestine without perforation or abscess without bleeding: Secondary | ICD-10-CM | POA: Diagnosis not present

## 2023-11-20 DIAGNOSIS — Z1211 Encounter for screening for malignant neoplasm of colon: Secondary | ICD-10-CM | POA: Diagnosis present

## 2023-11-20 MED ORDER — SODIUM CHLORIDE 0.9 % IV SOLN: 500.0000 mL | Freq: Once | INTRAVENOUS | Status: DC

## 2023-11-20 NOTE — Op Note (Signed)
  [facility] Patient Name: Tina Hart Procedure Date: 11/20/2023 1:28 PM MRN: [id number] Endoscopist: Sandor Flatter , MD, [id number] Age: 48 Referring MD:  Date of Birth: [birth year] Gender: Female Account #: [id number] Procedure:                Colonoscopy Indications:              Screening for colorectal malignant neoplasm Medicines:                Monitored Anesthesia Care Procedure:                Pre-Anesthesia Assessment:                           - Prior to the procedure, a History and Physical                            was performed, and patient medications and                            allergies were reviewed. The patient's tolerance of                            previous anesthesia was also reviewed. The risks                            and benefits of the procedure and the sedation                            options and risks were discussed with the patient.                            All questions were answered, and informed consent                            was obtained. Prior Anticoagulants: The patient has                            taken no anticoagulant or antiplatelet agents. ASA                            Grade Assessment: II - A patient with mild systemic                            disease. After reviewing the risks and benefits,                            the patient was deemed in satisfactory condition to                            undergo the procedure.                           After obtaining informed consent, the colonoscope  was passed under direct vision. Throughout the                            procedure, the patient's blood pressure, pulse, and                            oxygen saturations were monitored continuously. The                            CF [id number] #[id number] was introduced through the anus                            and advanced to the the cecum, identified by                            appendiceal  orifice and ileocecal valve. The                            colonoscopy was performed without difficulty. The                            patient tolerated the procedure well. The quality                            of the bowel preparation was good. The ileocecal                            valve, appendiceal orifice, and rectum were                            photographed. Scope In: 1:50:20 PM Scope Out: 2:04:15 PM Scope Withdrawal Time: 0 hours 8 minutes 39 seconds  Total Procedure Duration: 0 hours 13 minutes 55 seconds  Findings:                 The perianal and digital rectal examinations were                            normal.                           A few medium-mouthed and small-mouthed diverticula                            were found in the sigmoid colon, descending colon                            and ascending colon.                           Non-bleeding internal hemorrhoids were found during                            retroflexion. The hemorrhoids were small. Complications:            No immediate complications. Estimated Blood Loss:  Estimated blood loss: none. Impression:               - Diverticulosis in the sigmoid colon, in the                            descending colon and in the ascending colon.                           - Non-bleeding internal hemorrhoids.                           - The remainder of the colon was otherwise normal                            appearing.                           - No specimens collected. Recommendation:           - Patient has a contact number available for                            emergencies. The signs and symptoms of potential                            delayed complications were discussed with the                            patient. Return to normal activities tomorrow.                            Written discharge instructions were provided to the                            patient.                           - Resume  previous diet.                           - Continue present medications.                           - Repeat colonoscopy in 10 years for screening                            purposes.                           - Return to GI clinic PRN. Sandor Flatter, MD 11/20/2023 2:09:32 PM

## 2023-11-20 NOTE — Patient Instructions (Signed)
   Handouts on diverticulosis,& hemorrhoids given to you today.   Continue previous diet & medications   YOU HAD AN ENDOSCOPIC PROCEDURE TODAY AT THE [facility]:   Refer to the procedure report that was given to you for any specific questions about what was found during the examination.  If the procedure report does not answer your questions, please call your gastroenterologist to clarify.  If you requested that your care partner not be given the details of your procedure findings, then the procedure report has been included in a sealed envelope for you to review at your convenience later.  YOU SHOULD EXPECT: Some feelings of bloating in the abdomen. Passage of more gas than usual.  Walking can help get rid of the air that was put into your GI tract during the procedure and reduce the bloating. If you had a lower endoscopy (such as a colonoscopy or flexible sigmoidoscopy) you may notice spotting of blood in your stool or on the toilet paper. If you underwent a bowel prep for your procedure, you may not have a normal bowel movement for a few days.  Please Note:  You might notice some irritation and congestion in your nose or some drainage.  This is from the oxygen used during your procedure.  There is no need for concern and it should clear up in a day or so.  SYMPTOMS TO REPORT IMMEDIATELY:  Following lower endoscopy (colonoscopy or flexible sigmoidoscopy):  Excessive amounts of blood in the stool  Significant tenderness or worsening of abdominal pains  Swelling of the abdomen that is new, acute  Fever of 100F or higher   For urgent or emergent issues, a gastroenterologist can be reached at any hour by calling [phone]. Do not use MyChart messaging for urgent concerns.    DIET:  We do recommend a small meal at first, but then you may proceed to your regular diet.  Drink plenty of fluids but you should avoid alcoholic beverages for 24 hours.  ACTIVITY:  You should plan  to take it easy for the rest of today and you should NOT DRIVE or use heavy machinery until tomorrow (because of the sedation medicines used during the test).    FOLLOW UP: Our staff will call the number listed on your records the next business day following your procedure.  We will call around 7:15- 8:00 am to check on you and address any questions or concerns that you may have regarding the information given to you following your procedure. If we do not reach you, we will leave a message.     If any biopsies were taken you will be contacted by phone or by letter within the next 1-3 weeks.  Please call us  at [phone] if you have not heard about the biopsies in 3 weeks.    SIGNATURES/CONFIDENTIALITY: You and/or your care partner have signed paperwork which will be entered into your electronic medical record.  These signatures attest to the fact that that the information above on your After Visit Summary has been reviewed and is understood.  Full responsibility of the confidentiality of this discharge information lies with you and/or your care-partner.

## 2023-11-20 NOTE — Progress Notes (Signed)
 GASTROENTEROLOGY PROCEDURE H&P NOTE   Primary Care Physician: Gorge Ade, MD    Reason for Procedure:  Colon Cancer screening  Plan:    Colonoscopy  Patient is appropriate for endoscopic procedure(s) in the ambulatory ([facility]) setting.  The nature of the procedure, as well as the risks, benefits, and alternatives were carefully and thoroughly reviewed with the patient. Ample time for discussion and questions allowed. The patient understood, was satisfied, and agreed to proceed.     HPI: Tina Hart is a 48 y.o. female who presents for colonoscopy for routine Colon Cancer screening.  No active GI symptoms.  No known family history of colon cancer or related malignancy.  Patient is otherwise without complaints or active issues today.  Past Medical History:  Diagnosis Date   Anxiety    Cancer (HCC)    GERD (gastroesophageal reflux disease)    WITH PREGNANCY   Headache    Hyperthyroidism    PONV (postoperative nausea and vomiting)    Post partum depression     Past Surgical History:  Procedure Laterality Date   Breast resconstruction with flap  2023   CARPAL TUNNEL RELEASE Right 06/2016   CESAREAN SECTION     CESAREAN SECTION WITH BILATERAL TUBAL LIGATION Bilateral 10/02/2014   Procedure: Repeat CESAREAN SECTION WITH BILATERAL TUBAL LIGATION;  Surgeon: Ade JINNY Gorge, MD;  Location: [facility];  Service: Obstetrics;  Laterality: Bilateral;  EDD: 10/09/14   COLONOSCOPY     ENDOMETRIAL ABLATION W/ NOVASURE  03/2015   Masectomy  2020   OOPHORECTOMY Left 09/18/2016   Procedure: OOPHORECTOMY;  Surgeon: Ade Gorge, MD;  Location: [facility];  Service: Gynecology;  Laterality: Left;   ROBOTIC ASSISTED TOTAL HYSTERECTOMY WITH SALPINGECTOMY Bilateral 09/18/2016   Procedure: ROBOTIC ASSISTED TOTAL HYSTERECTOMY WITH SALPINGECTOMY;  Surgeon: Ade Gorge, MD;  Location: [facility];  Service: Gynecology;  Laterality: Bilateral;   WISDOM TOOTH EXTRACTION      Prior to Admission  medications   Medication Sig Start Date End Date Taking? Authorizing Provider  NP THYROID  15 MG tablet Take 15 mg by mouth daily. 10/10/23  Yes [provider]  sertraline  (ZOLOFT ) 25 MG tablet Take 3 tablets (75 mg total) by mouth daily. 10/05/14  Yes Con Merle, CNM  SYNTHROID  88 MCG tablet Take 88 mcg by mouth daily.   Yes [provider]  ondansetron  (ZOFRAN -ODT) 8 MG disintegrating tablet Take 8 mg by mouth 3 (three) times daily. 06/12/23   [provider]  OZEMPIC, 1 MG/DOSE, 4 MG/3ML SOPN Inject 1 mg into the skin once a week.    [provider]  triamcinolone  (KENALOG ) 0.1 % paste Use as directed 1 application. in the mouth or throat 2 (two) times daily. Patient not taking: Reported on 11/20/2023 02/20/22   Curtis Debby JINNY, MD  valACYclovir  (VALTREX ) 500 MG tablet Take 1 tablet (500 mg total) by mouth 2 (two) times daily as needed (for cold sores). 05/24/20   Breeback, Jade L, PA-C  ZOLMitriptan  (ZOMIG ) 2.5 MG tablet Take 1 tablet (2.5 mg total) by mouth every 2 (two) hours as needed for migraine. 05/24/20   Breeback, Jade L, PA-C    Current Outpatient Medications  Medication Sig Dispense Refill   NP THYROID  15 MG tablet Take 15 mg by mouth daily.     sertraline  (ZOLOFT ) 25 MG tablet Take 3 tablets (75 mg total) by mouth daily. 90 tablet 0   SYNTHROID  88 MCG tablet Take 88 mcg by mouth daily.  ondansetron  (ZOFRAN -ODT) 8 MG disintegrating tablet Take 8 mg by mouth 3 (three) times daily.     OZEMPIC, 1 MG/DOSE, 4 MG/3ML SOPN Inject 1 mg into the skin once a week.     triamcinolone  (KENALOG ) 0.1 % paste Use as directed 1 application. in the mouth or throat 2 (two) times daily. (Patient not taking: Reported on 11/20/2023) 5 g 11   valACYclovir  (VALTREX ) 500 MG tablet Take 1 tablet (500 mg total) by mouth 2 (two) times daily as needed (for cold sores). 30 tablet 0   ZOLMitriptan  (ZOMIG ) 2.5 MG tablet Take 1 tablet (2.5 mg total) by mouth every 2 (two)  hours as needed for migraine. 10 tablet 1   Current Facility-Administered Medications  Medication Dose Route Frequency Provider Last Rate Last Admin   0.9 %  sodium chloride  infusion  500 mL Intravenous Once Xaiden Fleig V, DO        Allergies as of 11/20/2023 - Review Complete 11/20/2023  Allergen Reaction Noted   Erythromycin Other (See Comments) 09/23/2007   Morphine  Hives 02/05/2019   Other Rash 10/16/2018   Sulfonamide derivatives Hives 04/18/2023   Wound dressing adhesive Rash 06/23/2022    Family History  Problem Relation Age of Onset   Breast cancer Mother 39   Pancreatic cancer Mother 64   Colon cancer Neg Hx    Colon polyps Neg Hx    Esophageal cancer Neg Hx    Rectal cancer Neg Hx    Stomach cancer Neg Hx     Social History   Socioeconomic History   Marital status: Married    Spouse name: Not on file   Number of children: Not on file   Years of education: Not on file   Highest education level: Not on file  Occupational History   Not on file  Tobacco Use   Smoking status: Never   Smokeless tobacco: Never  Vaping Use   Vaping status: Never Used  Substance and Sexual Activity   Alcohol use: Not Currently   Drug use: No   Sexual activity: Not on file  Other Topics Concern   Not on file  Social History Narrative   Not on file   Social Drivers of Health   Financial Resource Strain: Not on file  Food Insecurity: Not on file  Transportation Needs: Not on file  Physical Activity: Not on file  Stress: Not on file  Social Connections: Not on file  Intimate Partner Violence: Not on file    Physical Exam: Vital signs in last 24 hours: @BP  122/76   Pulse 75   Temp 97.9 F (36.6 C) (Skin)   Ht 5' 2 (1.575 m)   Wt 148 lb (67.1 kg)   LMP 06/13/2016   SpO2 97%   BMI 27.07 kg/m  GEN: NAD EYE: Sclerae anicteric ENT: MMM CV: Non-tachycardic Pulm: CTA b/l GI: Soft, NT/ND NEURO:  Alert & Oriented x 3   Sandor Flatter, DO [facility]   11/20/2023 1:42 PM

## 2023-11-20 NOTE — Progress Notes (Signed)
 Pt's states no medical or surgical changes since previsit or office visit.

## 2023-11-20 NOTE — Progress Notes (Signed)
 Sedate, gd SR, tolerated procedure well, VSS, report to RN

## 2023-11-21 ENCOUNTER — Telehealth: Payer: Self-pay | Admitting: *Deleted

## 2023-11-21 NOTE — Telephone Encounter (Signed)
  Follow up Call-     11/20/2023   12:51 PM  Call back number  Post procedure Call Back phone  # [phone]  Permission to leave phone message Yes     Patient questions:  Do you have a fever, pain , or abdominal swelling? No. Pain Score  0 *  Have you tolerated food without any problems? Yes.    Have you been able to return to your normal activities? Yes.    Do you have any questions about your discharge instructions: Diet   No. Medications  No. Follow up visit  No.  Do you have questions or concerns about your Care? No.  Actions: * If pain score is 4 or above: No action needed, pain <4.

## 2024-04-22 ENCOUNTER — Other Ambulatory Visit (HOSPITAL_COMMUNITY): Payer: Self-pay

## 2024-07-15 ENCOUNTER — Encounter: Payer: Self-pay | Admitting: Sports Medicine
